# Patient Record
Sex: Male | Born: 1955
Health system: Southern US, Community
[De-identification: ages and names within clinical notes are randomized; demographics above are authoritative.]

## PROBLEM LIST (undated history)

## (undated) DIAGNOSIS — N529 Male erectile dysfunction, unspecified: Secondary | ICD-10-CM

## (undated) DIAGNOSIS — R7301 Impaired fasting glucose: Secondary | ICD-10-CM

## (undated) DIAGNOSIS — G4733 Obstructive sleep apnea (adult) (pediatric): Secondary | ICD-10-CM

## (undated) DIAGNOSIS — Z9989 Dependence on other enabling machines and devices: Secondary | ICD-10-CM

## (undated) DIAGNOSIS — E291 Testicular hypofunction: Secondary | ICD-10-CM

## (undated) DIAGNOSIS — I10 Essential (primary) hypertension: Secondary | ICD-10-CM

## (undated) DIAGNOSIS — E785 Hyperlipidemia, unspecified: Secondary | ICD-10-CM

## (undated) HISTORY — DX: Essential (primary) hypertension: I10

## (undated) HISTORY — DX: Dependence on other enabling machines and devices: Z99.89

## (undated) HISTORY — PX: NO PAST SURGERIES: SHX2092

## (undated) HISTORY — DX: Male erectile dysfunction, unspecified: N52.9

## (undated) HISTORY — DX: Obstructive sleep apnea (adult) (pediatric): G47.33

## (undated) HISTORY — DX: Hyperlipidemia, unspecified: E78.5

## (undated) HISTORY — DX: Testicular hypofunction: E29.1

---
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
## 2007-03-04 DIAGNOSIS — R9431 Abnormal electrocardiogram [ECG] [EKG]: Secondary | ICD-10-CM | POA: Insufficient documentation

## 2014-09-08 DIAGNOSIS — G4710 Hypersomnia, unspecified: Secondary | ICD-10-CM | POA: Insufficient documentation

## 2015-02-04 DIAGNOSIS — N401 Enlarged prostate with lower urinary tract symptoms: Secondary | ICD-10-CM | POA: Insufficient documentation

## 2015-02-25 LAB — HM COLONOSCOPY

## 2015-10-25 DIAGNOSIS — J40 Bronchitis, not specified as acute or chronic: Secondary | ICD-10-CM | POA: Diagnosis not present

## 2015-12-06 DIAGNOSIS — G4733 Obstructive sleep apnea (adult) (pediatric): Secondary | ICD-10-CM | POA: Diagnosis not present

## 2015-12-27 DIAGNOSIS — I10 Essential (primary) hypertension: Secondary | ICD-10-CM | POA: Diagnosis not present

## 2016-03-07 DIAGNOSIS — I10 Essential (primary) hypertension: Secondary | ICD-10-CM | POA: Diagnosis not present

## 2016-03-07 DIAGNOSIS — E785 Hyperlipidemia, unspecified: Secondary | ICD-10-CM | POA: Diagnosis not present

## 2016-03-07 DIAGNOSIS — G4733 Obstructive sleep apnea (adult) (pediatric): Secondary | ICD-10-CM | POA: Diagnosis not present

## 2016-03-07 DIAGNOSIS — R7301 Impaired fasting glucose: Secondary | ICD-10-CM | POA: Diagnosis not present

## 2016-03-08 DIAGNOSIS — G4710 Hypersomnia, unspecified: Secondary | ICD-10-CM | POA: Diagnosis not present

## 2016-03-08 DIAGNOSIS — G4733 Obstructive sleep apnea (adult) (pediatric): Secondary | ICD-10-CM | POA: Diagnosis not present

## 2016-03-31 DIAGNOSIS — B356 Tinea cruris: Secondary | ICD-10-CM | POA: Diagnosis not present

## 2016-06-07 DIAGNOSIS — G4733 Obstructive sleep apnea (adult) (pediatric): Secondary | ICD-10-CM | POA: Diagnosis not present

## 2016-06-07 DIAGNOSIS — H2513 Age-related nuclear cataract, bilateral: Secondary | ICD-10-CM | POA: Diagnosis not present

## 2016-09-06 DIAGNOSIS — G4733 Obstructive sleep apnea (adult) (pediatric): Secondary | ICD-10-CM | POA: Diagnosis not present

## 2016-09-07 DIAGNOSIS — G4733 Obstructive sleep apnea (adult) (pediatric): Secondary | ICD-10-CM | POA: Diagnosis not present

## 2016-09-07 DIAGNOSIS — I10 Essential (primary) hypertension: Secondary | ICD-10-CM | POA: Diagnosis not present

## 2017-04-02 DIAGNOSIS — Z7689 Persons encountering health services in other specified circumstances: Secondary | ICD-10-CM | POA: Diagnosis not present

## 2017-04-02 DIAGNOSIS — I10 Essential (primary) hypertension: Secondary | ICD-10-CM | POA: Diagnosis not present

## 2017-04-02 DIAGNOSIS — Z760 Encounter for issue of repeat prescription: Secondary | ICD-10-CM | POA: Diagnosis not present

## 2017-04-02 DIAGNOSIS — E785 Hyperlipidemia, unspecified: Secondary | ICD-10-CM | POA: Diagnosis not present

## 2017-05-01 DIAGNOSIS — G4733 Obstructive sleep apnea (adult) (pediatric): Secondary | ICD-10-CM | POA: Diagnosis not present

## 2017-09-16 DIAGNOSIS — G4733 Obstructive sleep apnea (adult) (pediatric): Secondary | ICD-10-CM | POA: Diagnosis not present

## 2017-09-17 DIAGNOSIS — I10 Essential (primary) hypertension: Secondary | ICD-10-CM | POA: Diagnosis not present

## 2017-09-17 DIAGNOSIS — G4710 Hypersomnia, unspecified: Secondary | ICD-10-CM | POA: Diagnosis not present

## 2017-09-17 DIAGNOSIS — G4733 Obstructive sleep apnea (adult) (pediatric): Secondary | ICD-10-CM | POA: Diagnosis not present

## 2017-09-30 ENCOUNTER — Encounter (INDEPENDENT_AMBULATORY_CARE_PROVIDER_SITE_OTHER): Payer: Self-pay

## 2017-09-30 ENCOUNTER — Encounter: Payer: Self-pay | Admitting: Physician Assistant

## 2017-09-30 ENCOUNTER — Ambulatory Visit (INDEPENDENT_AMBULATORY_CARE_PROVIDER_SITE_OTHER): Payer: BLUE CROSS/BLUE SHIELD | Admitting: Physician Assistant

## 2017-09-30 VITALS — BP 149/88 | HR 66 | Ht 70.0 in | Wt 189.0 lb

## 2017-09-30 DIAGNOSIS — Z7689 Persons encountering health services in other specified circumstances: Secondary | ICD-10-CM | POA: Diagnosis not present

## 2017-09-30 DIAGNOSIS — Z130 Encounter for screening for diseases of the blood and blood-forming organs and certain disorders involving the immune mechanism: Secondary | ICD-10-CM | POA: Diagnosis not present

## 2017-09-30 DIAGNOSIS — Z5181 Encounter for therapeutic drug level monitoring: Secondary | ICD-10-CM | POA: Diagnosis not present

## 2017-09-30 DIAGNOSIS — I10 Essential (primary) hypertension: Secondary | ICD-10-CM | POA: Insufficient documentation

## 2017-09-30 DIAGNOSIS — Z125 Encounter for screening for malignant neoplasm of prostate: Secondary | ICD-10-CM

## 2017-09-30 DIAGNOSIS — Z79899 Other long term (current) drug therapy: Secondary | ICD-10-CM | POA: Diagnosis not present

## 2017-09-30 DIAGNOSIS — N529 Male erectile dysfunction, unspecified: Secondary | ICD-10-CM | POA: Diagnosis not present

## 2017-09-30 DIAGNOSIS — Z113 Encounter for screening for infections with a predominantly sexual mode of transmission: Secondary | ICD-10-CM | POA: Diagnosis not present

## 2017-09-30 DIAGNOSIS — E291 Testicular hypofunction: Secondary | ICD-10-CM | POA: Diagnosis not present

## 2017-09-30 MED ORDER — SILDENAFIL CITRATE 20 MG PO TABS: ORAL_TABLET | ORAL | 2 refills | Status: DC

## 2017-09-30 MED ORDER — ASPIRIN EC 81 MG PO TBEC: 81.0000 mg | DELAYED_RELEASE_TABLET | Freq: Every day | ORAL | 3 refills | Status: DC

## 2017-09-30 MED ORDER — TRIAMTERENE-HCTZ 37.5-25 MG PO TABS: 1.0000 | ORAL_TABLET | Freq: Every day | ORAL | 0 refills | Status: DC

## 2017-09-30 MED ORDER — METOPROLOL TARTRATE 50 MG PO TABS: 50.0000 mg | ORAL_TABLET | Freq: Every day | ORAL | 0 refills | Status: DC

## 2017-09-30 NOTE — Progress Notes (Signed)
HPI:                                                                Andres Jordan is a 62 y.o. male who presents to [facility]: Primary Care [facility] today to establish care  Current concerns include: medication refills, ED  HTN: taking Traimterene-HCTZ and Metoprolol daily. Compliant with medications. Did not take his medication today because he just finished night shift. Does not check BP's at home. Denies vision change, headache, chest pain with exertion, orthopnea, lightheadedness, syncope and edema. Risk factors include: male sex, age>55, HLD, family history   ED: reports he has taken Sildenafil in the past, 3-4 tablets usually works for him. He is not currently sexually active. He has had 1 male partner in the last 6 months, uses condoms consistently. No history of STI. SHIM    Row Name 09/30/17 1515         SHIM: Over the last 6 months:   How do you rate your confidence that you could get and keep an erection?  Low     When you had erections with sexual stimulation, how often were your erections hard enough for penetration (entering your partner)?  Almost Never or Never     During sexual intercourse, how often were you able to maintain your erection after you had penetrated (entered) your partner?  Almost Never or Never     During sexual intercourse, how difficult was it to maintain your erection to completion of intercourse?  Very Difficult     When you attempted sexual intercourse, how often was it satisfactory for you?  Almost Never or Never       SHIM Total Score   SHIM  7       HLD: taking Zocor 40 mg. Mostly compliant with medications. Denies myalgias.  Depression screen [facility] 2/9 09/30/2017  Decreased Interest 0  Down, Depressed, Hopeless 0  PHQ - 2 Score 0    No flowsheet data found.    Past Medical History:  Diagnosis Date  . Erectile dysfunction   . Hyperlipidemia   . Hypertension   . Hypogonadism in male    Past Surgical  History:  Procedure Laterality Date  . NO PAST SURGERIES     Social History   Tobacco Use  . Smoking status: Never Smoker  . Smokeless tobacco: Never Used  Substance Use Topics  . Alcohol use: No    Frequency: Never   family history includes Aneurysm in his mother; Diabetes in his sister; Heart attack in his father; Hypertension in his sister.    ROS: negative except as noted in the HPI  Medications: Current Outpatient Medications  Medication Sig Dispense Refill  . metoprolol tartrate (LOPRESSOR) 50 MG tablet Take 1 tablet (50 mg total) by mouth daily. 30 tablet 0  . simvastatin (ZOCOR) 40 MG tablet Take 1 tablet by mouth daily.    Marland Kitchen triamterene-hydrochlorothiazide (MAXZIDE-25) 37.5-25 MG tablet Take 1 tablet by mouth daily. 30 tablet 0  . aspirin EC 81 MG tablet Take 1 tablet (81 mg total) by mouth daily. 90 tablet 3  . sildenafil (REVATIO) 20 MG tablet Take 1 - 5 tablets PO as needed 30 minutes prior to sexual activity 50 tablet 2  No current facility-administered medications for this visit.    No Known Allergies     Objective:  BP (!) 149/88   Pulse 66   Ht 5\' 10"  (1.778 m)   Wt 189 lb (85.7 kg)   BMI 27.12 kg/m  Gen:  alert, not ill-appearing, no distress, appropriate for age HEENT: head normocephalic without obvious abnormality, conjunctiva and cornea clear, trachea midline Pulm: Normal work of breathing, normal phonation, clear to auscultation bilaterally, no wheezes, rales or rhonchi CV: Normal rate, regular rhythm, s1 and s2 distinct, no murmurs, clicks or rubs  Neuro: alert and oriented x 3, no tremor MSK: extremities atraumatic, normal gait and station, no peripheral edema Skin: intact, no rashes on exposed skin, no jaundice, no cyanosis Psych: well-groomed, cooperative, good eye contact, euthymic mood, affect mood-congruent, speech is articulate, and thought processes clear and goal-directed    No results found for this or any previous visit (from the  past 72 hour(s)). No results found.    Assessment and Plan: 62 y.o. male with   1. Encounter to establish care - reviewed PMH, PSH, PFH, medications and allergies - reviewed health maintenance - colonoscopy UTD per patient, requesting records from [facility] - negative PHQ2 - influenza UTD per patient  2. Routine screening for STI (sexually transmitted infection) - low risk sexual behaviors. Not a candidate for PrEP - Hepatitis C antibody - HIV antibody - C. trachomatis/N. gonorrhoeae RNA - RPR  3. Screening PSA (prostate specific antigen) - PSA  4. Male hypogonadism - Testosterone  5. Erectile dysfunction, unspecified erectile dysfunction type - SHIM score 7 - sildenafil (REVATIO) 20 MG tablet; Take 1 - 5 tablets PO as needed 30 minutes prior to sexual activity  Dispense: 50 tablet; Refill: 2  6. Encounter for monitoring statin therapy - LDL goal<100. May switch him to Atorvastatin depending on LDL - Lipid Panel w/reflex Direct LDL  7. Hypertension goal BP (blood pressure) < 130/80 BP Readings from Last 3 Encounters:  09/30/17 (!) 149/88  - BP out of range. Patient has not had his medications today - aspirin 81 mg for primary prevention - counseled on therapeutic lifestyle changes - follow-up in 2 weeks for nurse BP check - metoprolol tartrate (LOPRESSOR) 50 MG tablet; Take 1 tablet (50 mg total) by mouth daily.  Dispense: 30 tablet; Refill: 0 - triamterene-hydrochlorothiazide (MAXZIDE-25) 37.5-25 MG tablet; Take 1 tablet by mouth daily.  Dispense: 30 tablet; Refill: 0 - COMPLETE METABOLIC PANEL WITH GFR - aspirin EC 81 MG tablet; Take 1 tablet (81 mg total) by mouth daily.  Dispense: 90 tablet; Refill: 3  8. Screening for blood disease - CBC - COMPLETE METABOLIC PANEL WITH GFR   Patient education and anticipatory guidance given Patient agrees with treatment plan Follow-up in 2 weeks for nurse BP check, then every 6 months for medication management as  needed if symptoms worsen or fail to improve  Levonne Hubertharley E. Treylen Gibbs PA-C

## 2017-09-30 NOTE — Patient Instructions (Signed)
For your blood pressure: - Goal <130/80 - continue your blood pressure medications daily - baby aspirin 81 mg daily to help prevent heart attack/stroke - monitor and log blood pressures at home - check around the same time each day in a relaxed setting - Limit salt to <2000 mg/day - Follow DASH eating plan - limit alcohol to 2 standard drinks per day for men and 1 per day for women - avoid tobacco products - weight loss: 7% of current body weight - follow-up every 6 months for your blood pressure    Engage in aerobic physical activity to reduce LDL-cholesterol, non-HDL-cholesterol, and blood pressure  Frequency: 3-4 sessions per week  Intensity: moderate to vigorous  Duration: 40 minutes on average  Physical Activity Recommendations for secondary prevention 1. Aerobic exercise  Frequency: 3-5 sessions per week  Intensity: 50-80% capacity  Duration: 20 - 60 minutes  Examples: walking, treadmill, cycling, rowing, stair climbing, and arm/leg ergometry  2. Resistance exercise  Frequency: 2-3 sessions per week  Intensity: 10-15 repetitions/set to moderate fatigue  Duration: 1-3 sets of 8-10 upper and lower body exercises  Examples: calisthenics, elastic bands, cuff/hand weights, dumbbels, free weights, wall pulleys, and weight machines  Heart-Healthy Lifestyle  Eating a diet rich in vegetables, fruits and whole grains: also includes low-fat dairy products, poultry, fish, legumes, and nuts; limit intake of sweets, sugar-sweetened beverages and red meats  Getting regular exercise  Maintaining a healthy weight  Not smoking or getting help quitting  Staying on top of your health; for some people, lifestyle changes alone may not be enough to prevent a heart attack or stroke. In these cases, taking a statin at the right dose will most likely be necessary

## 2017-10-01 ENCOUNTER — Encounter: Payer: Self-pay | Admitting: Physician Assistant

## 2017-10-01 ENCOUNTER — Other Ambulatory Visit: Payer: Self-pay | Admitting: Physician Assistant

## 2017-10-01 DIAGNOSIS — I10 Essential (primary) hypertension: Secondary | ICD-10-CM

## 2017-10-01 DIAGNOSIS — E782 Mixed hyperlipidemia: Secondary | ICD-10-CM | POA: Insufficient documentation

## 2017-10-01 DIAGNOSIS — Z9989 Dependence on other enabling machines and devices: Secondary | ICD-10-CM

## 2017-10-01 DIAGNOSIS — G4733 Obstructive sleep apnea (adult) (pediatric): Secondary | ICD-10-CM

## 2017-10-01 HISTORY — DX: Obstructive sleep apnea (adult) (pediatric): G47.33

## 2017-10-01 LAB — HIV ANTIBODY (ROUTINE TESTING W REFLEX): HIV 1&2 Ab, 4th Generation: NONREACTIVE

## 2017-10-01 LAB — COMPLETE METABOLIC PANEL WITH GFR
AG Ratio: 1.9 (calc) (ref 1.0–2.5)
ALBUMIN MSPROF: 4.4 g/dL (ref 3.6–5.1)
ALT: 28 U/L (ref 9–46)
AST: 23 U/L (ref 10–35)
Alkaline phosphatase (APISO): 72 U/L (ref 40–115)
BUN: 24 mg/dL (ref 7–25)
CALCIUM: 9.8 mg/dL (ref 8.6–10.3)
CO2: 32 mmol/L (ref 20–32)
CREATININE: 1.06 mg/dL (ref 0.70–1.25)
Chloride: 103 mmol/L (ref 98–110)
GFR, EST NON AFRICAN AMERICAN: 75 mL/min/{1.73_m2} (ref >=60)
GFR, Est African American: 87 mL/min/{1.73_m2} (ref >=60)
GLOBULIN: 2.3 g/dL (ref 1.9–3.7)
Glucose, Bld: 103 mg/dL — ABNORMAL HIGH (ref 65–99)
Potassium: 3.8 mmol/L (ref 3.5–5.3)
SODIUM: 141 mmol/L (ref 135–146)
Total Bilirubin: 0.7 mg/dL (ref 0.2–1.2)
Total Protein: 6.7 g/dL (ref 6.1–8.1)

## 2017-10-01 LAB — CBC
HCT: 47.8 % (ref 38.5–50.0)
HEMOGLOBIN: 16.8 g/dL (ref 13.2–17.1)
MCH: 31.3 pg (ref 27.0–33.0)
MCHC: 35.1 g/dL (ref 32.0–36.0)
MCV: 89.2 fL (ref 80.0–100.0)
MPV: 10.1 fL (ref 7.5–12.5)
Platelets: 230 10*3/uL (ref 140–400)
RBC: 5.36 10*6/uL (ref 4.20–5.80)
RDW: 12.6 % (ref 11.0–15.0)
WBC: 4.6 10*3/uL (ref 3.8–10.8)

## 2017-10-01 LAB — LIPID PANEL W/REFLEX DIRECT LDL
CHOL/HDL RATIO: 5.2 (calc) — AB (ref <5.0)
Cholesterol: 267 mg/dL — ABNORMAL HIGH (ref <200)
HDL: 51 mg/dL (ref >40)
LDL CHOLESTEROL (CALC): 177 mg/dL — AB
NON-HDL CHOLESTEROL (CALC): 216 mg/dL — AB (ref <130)
TRIGLYCERIDES: 217 mg/dL — AB (ref <150)

## 2017-10-01 LAB — HEPATITIS C ANTIBODY
Hepatitis C Ab: NONREACTIVE
SIGNAL TO CUT-OFF: 0.05 (ref <1.00)

## 2017-10-01 LAB — C. TRACHOMATIS/N. GONORRHOEAE RNA
C. TRACHOMATIS RNA, TMA: NOT DETECTED
N. gonorrhoeae RNA, TMA: NOT DETECTED

## 2017-10-01 LAB — PSA: PSA: 2.1 ng/mL (ref <=4.0)

## 2017-10-01 LAB — RPR: RPR Ser Ql: NONREACTIVE

## 2017-10-01 LAB — TESTOSTERONE: Testosterone: 342 ng/dL (ref 250–827)

## 2017-10-01 MED ORDER — METOPROLOL TARTRATE 50 MG PO TABS: 50.0000 mg | ORAL_TABLET | Freq: Every day | ORAL | 1 refills | Status: DC

## 2017-10-01 MED ORDER — TRIAMTERENE-HCTZ 37.5-25 MG PO TABS: 1.0000 | ORAL_TABLET | Freq: Every day | ORAL | 1 refills | Status: DC

## 2017-10-01 MED ORDER — ATORVASTATIN CALCIUM 20 MG PO TABS: 20.0000 mg | ORAL_TABLET | Freq: Every day | ORAL | 1 refills | Status: DC

## 2017-10-01 NOTE — Progress Notes (Signed)
Addendum: testosterone is in a normal range. No medication needed

## 2017-10-01 NOTE — Progress Notes (Signed)
STI screening negative LDL cholesterol is very high (177) and we want this below 100. I am going to switch him to Atorvastatin 20 mg at bedtime and want to recheck fasting lipid panel in 3 months Refilling other medications for 6 month supply

## 2017-10-14 ENCOUNTER — Ambulatory Visit: Payer: BLUE CROSS/BLUE SHIELD

## 2017-10-15 ENCOUNTER — Ambulatory Visit (INDEPENDENT_AMBULATORY_CARE_PROVIDER_SITE_OTHER): Payer: BLUE CROSS/BLUE SHIELD | Admitting: Physician Assistant

## 2017-10-15 VITALS — BP 121/60 | HR 69 | Temp 97.7°F | Resp 16 | Wt 193.1 lb

## 2017-10-15 DIAGNOSIS — I10 Essential (primary) hypertension: Secondary | ICD-10-CM | POA: Diagnosis not present

## 2017-10-15 NOTE — Progress Notes (Signed)
HPI: Patient is here for a blood pressure check. Patient denies chest pains, palpitations, shortness of breath, blurred vision, dizziness or medication problems.   Vitals:   10/15/17 1003  BP: 121/60  Pulse: 69  Resp: 16  Temp: 97.7 F (36.5 C)  SpO2: 96%   BP Readings from Last 3 Encounters:  10/15/17 121/60  09/30/17 (!) 149/88    Assessment and Plan: Patient blood pressure reading was with normal limits. Patient advised he will be contacted by our office if another nurse visit is needed once provider reviews results.

## 2017-10-16 ENCOUNTER — Encounter: Payer: Self-pay | Admitting: Physician Assistant

## 2018-02-03 DIAGNOSIS — G4733 Obstructive sleep apnea (adult) (pediatric): Secondary | ICD-10-CM | POA: Diagnosis not present

## 2018-02-10 ENCOUNTER — Encounter: Payer: Self-pay | Admitting: Emergency Medicine

## 2018-02-10 ENCOUNTER — Emergency Department (INDEPENDENT_AMBULATORY_CARE_PROVIDER_SITE_OTHER): Payer: BLUE CROSS/BLUE SHIELD

## 2018-02-10 ENCOUNTER — Emergency Department
Admission: EM | Admit: 2018-02-10 | Discharge: 2018-02-10 | Disposition: A | Discharge status: Home / Self Care | Payer: BLUE CROSS/BLUE SHIELD | Source: Home / Self Care | Attending: Family Medicine | Admitting: Family Medicine

## 2018-02-10 DIAGNOSIS — M79675 Pain in left toe(s): Secondary | ICD-10-CM | POA: Diagnosis not present

## 2018-02-10 DIAGNOSIS — M19072 Primary osteoarthritis, left ankle and foot: Secondary | ICD-10-CM

## 2018-02-10 DIAGNOSIS — M7989 Other specified soft tissue disorders: Secondary | ICD-10-CM

## 2018-02-10 DIAGNOSIS — M7732 Calcaneal spur, left foot: Secondary | ICD-10-CM | POA: Diagnosis not present

## 2018-02-10 MED ORDER — NAPROXEN 375 MG PO TABS: 375.0000 mg | ORAL_TABLET | Freq: Two times a day (BID) | ORAL | 0 refills | Status: DC

## 2018-02-10 NOTE — ED Triage Notes (Signed)
Pt c/o left foot pain, red and swollen.

## 2018-02-10 NOTE — Discharge Instructions (Addendum)
°  Your x-ray today shows that you have arthritis in your big toe joint. This is likely causing your pain and swelling but you may also have gout.  The blood test should result in another 2-3 days and you will be notified of the results.  Please see more information in this packet about gout and what you should eat and what you should avoid eating to help reduce gout pain and gout attacks.  Please follow up with your family doctor in 1 week if not improving, sooner if worsening or you get recurrent pain.

## 2018-02-10 NOTE — ED Provider Notes (Signed)
[name]    CSN: [id number] Arrival date & time: 02/10/18  1107     History   Chief Complaint Chief Complaint  Patient presents with  . Foot Pain    HPI Andres Jordan is a 62 y.o. male.   HPI  Andres Jordan is a 62 y.o. male presenting to UC with c/o 2 days of Left foot pain and swelling, most around his great toe.  Pain is aching and sore, worse with light touch. Moderate in severity. No prior hx of gout but his brother had gout 1 month ago.  Pt works night shift in [employer], which makes pain worse. No known injury.    Past Medical History:  Diagnosis Date  . Erectile dysfunction   . Hyperlipidemia   . Hypertension   . Hypogonadism in male   . OSA on CPAP 10/01/2017    Patient Active Problem List   Diagnosis Date Noted  . Mixed hyperlipidemia 10/01/2017  . OSA on CPAP 10/01/2017  . Erectile dysfunction 09/30/2017  . Encounter for monitoring statin therapy 09/30/2017  . Hypertension goal BP (blood pressure) < 130/80 09/30/2017  . Male hypogonadism 09/30/2017  . Benign non-nodular prostatic hyperplasia with lower urinary tract symptoms 02/04/2015  . Hypersomnia 09/08/2014  . Abnormal electrocardiogram 03/04/2007    Past Surgical History:  Procedure Laterality Date  . NO PAST SURGERIES         Home Medications    Prior to Admission medications   Medication Sig Start Date End Date Taking? Authorizing Provider  aspirin EC 81 MG tablet Take 1 tablet (81 mg total) by mouth daily. 09/30/17   Carlis Stable, PA-C  atorvastatin (LIPITOR) 20 MG tablet Take 1 tablet (20 mg total) by mouth at bedtime. 10/01/17   Carlis Stable, PA-C  metoprolol tartrate (LOPRESSOR) 50 MG tablet Take 1 tablet (50 mg total) by mouth daily. 10/01/17   Carlis Stable, PA-C  naproxen (NAPROSYN) 375 MG tablet Take 1 tablet (375 mg total) by mouth 2 (two) times daily. 02/10/18   Lurene Shadow, PA-C  sildenafil (REVATIO) 20 MG tablet  Take 1 - 5 tablets PO as needed 30 minutes prior to sexual activity 09/30/17   Carlis Stable, PA-C  triamterene-hydrochlorothiazide (MAXZIDE-25) 37.5-25 MG tablet Take 1 tablet by mouth daily. 10/01/17   Carlis Stable, PA-C    Family History Family History  Problem Relation Age of Onset  . Diabetes Sister   . Hypertension Sister   . Aneurysm Mother   . Heart attack Father     Social History Social History   Tobacco Use  . Smoking status: Never Smoker  . Smokeless tobacco: Never Used  Substance Use Topics  . Alcohol use: No    Frequency: Never  . Drug use: No     Allergies   Patient has no known allergies.   Review of Systems Review of Systems  Constitutional: Negative for chills and fever.  Musculoskeletal: Positive for arthralgias and joint swelling. Negative for myalgias.  Skin: Positive for color change. Negative for wound.  Neurological: Negative for weakness and numbness.     Physical Exam Triage Vital Signs ED Triage Vitals  Enc Vitals Group     BP 02/10/18 1153 (!) 166/80     Pulse Rate 02/10/18 1153 65     Resp --      Temp 02/10/18 1153 98.1 F (36.7 C)     Temp Source 02/10/18 1153 Oral  SpO2 02/10/18 1153 97 %     Weight 02/10/18 1154 195 lb (88.5 kg)     Height --      Head Circumference --      Peak Flow --      Pain Score 02/10/18 1154 9     Pain Loc --      Pain Edu? --      Excl. in GC? --    No data found.  Updated Vital Signs BP (!) 166/80 (BP Location: Right Arm)   Pulse 65   Temp 98.1 F (36.7 C) (Oral)   Wt 195 lb (88.5 kg)   SpO2 97%   BMI 27.98 kg/m   Visual Acuity Right Eye Distance:   Left Eye Distance:   Bilateral Distance:    Right Eye Near:   Left Eye Near:    Bilateral Near:     Physical Exam  Constitutional: He is oriented to person, place, and time. He appears well-developed and well-nourished.  HENT:  Head: Normocephalic and atraumatic.  Eyes: EOM are normal.  Neck: Normal  range of motion.  Cardiovascular: Normal rate.  Pulmonary/Chest: Effort normal.  Musculoskeletal: Normal range of motion. He exhibits edema and tenderness.  Left foot: mild edema to foot and great toe. Tenderness with light touch of 1st metatarsal joint. Full ROM but increased pain at great toe  Neurological: He is alert and oriented to person, place, and time.  Skin: Skin is warm and dry. Capillary refill takes less than 2 seconds.  Left foot: skin in tact. Erythema around 1st metatarsal joint  Psychiatric: He has a normal mood and affect. His behavior is normal.  Nursing note and vitals reviewed.    UC Treatments / Results  Labs (all labs ordered are listed, but only abnormal results are displayed) Labs Reviewed  URIC ACID    EKG None  Radiology Dg Foot Complete Left  Result Date: 02/10/2018 CLINICAL DATA:  Three-day history of LEFT great toe pain, erythema and swelling. No known injuries. EXAM: LEFT FOOT - COMPLETE 3+ VIEW COMPARISON:  None. FINDINGS: No evidence of acute, subacute or healed fractures. Mild narrowing of the first MTP joint space with associated hypertrophic spurring laterally. No erosive changes. Remaining joint spaces well-preserved throughout the foot. Bone mineral density well-preserved. Benign bone island in the head of the proximal phalanx of the great toe. Small to moderate-sized plantar calcaneal spur. IMPRESSION: 1. Mild osteoarthritis involving the first MTP joint. No evidence of erosions to confirm gout. 2. Moderate-sized plantar calcaneal spur. Electronically Signed   By: Hulan Saas M.D.   On: 02/10/2018 12:33    Procedures Procedures (including critical care time)  Medications Ordered in UC Medications - No data to display  Initial Impression / Assessment and Plan / UC Course  I have reviewed the triage vital signs and the nursing notes.  Pertinent labs & imaging results that were available during my care of the patient were reviewed by me  and considered in my medical decision making (see chart for details).     Hx and exam c/w OA of Left great toe and likely gout as well given erythematous skin and tenderness with light touch.  Uric acid blood work pending  Final Clinical Impressions(s) / UC Diagnoses   Final diagnoses:  Great toe pain, left  Pain and swelling of toe of left foot     Discharge Instructions      Your x-ray today shows that you have arthritis in your big toe  joint. This is likely causing your pain and swelling but you may also have gout.  The blood test should result in another 2-3 days and you will be notified of the results.  Please see more information in this packet about gout and what you should eat and what you should avoid eating to help reduce gout pain and gout attacks.  Please follow up with your family doctor in 1 week if not improving, sooner if worsening or you get recurrent pain.     ED Prescriptions    Medication Sig Dispense Auth. Provider   naproxen (NAPROSYN) 375 MG tablet Take 1 tablet (375 mg total) by mouth 2 (two) times daily. 20 tablet Lurene ShadowPhelps, Kenza Munar O, PA-C     Controlled Substance Prescriptions [place] Controlled Substance Registry consulted? Not Applicable   Rolla Platehelps, Consuela Widener O, PA-C 02/10/18 1346

## 2018-02-11 ENCOUNTER — Telehealth: Payer: Self-pay

## 2018-02-11 LAB — URIC ACID: Uric Acid, Serum: 7.7 mg/dL (ref 4.0–8.0)

## 2018-02-11 NOTE — Telephone Encounter (Signed)
Left message with lab results and to call UC if any questions or concerns.

## 2018-03-24 ENCOUNTER — Emergency Department
Admission: EM | Admit: 2018-03-24 | Discharge: 2018-03-24 | Disposition: A | Discharge status: Home / Self Care | Payer: BLUE CROSS/BLUE SHIELD | Source: Home / Self Care | Attending: Family Medicine | Admitting: Family Medicine

## 2018-03-24 ENCOUNTER — Encounter: Payer: Self-pay | Admitting: Emergency Medicine

## 2018-03-24 DIAGNOSIS — R221 Localized swelling, mass and lump, neck: Secondary | ICD-10-CM

## 2018-03-24 MED ORDER — CLINDAMYCIN HCL 300 MG PO CAPS: 300.0000 mg | ORAL_CAPSULE | Freq: Three times a day (TID) | ORAL | 0 refills | Status: AC

## 2018-03-24 NOTE — Discharge Instructions (Signed)
°  Please take antibiotics as prescribed and be sure to complete entire course even if you start to feel better to ensure infection does not come back.  An ultrasound has been ordered for further evaluation of your neck swelling.  You may have that ultrasound performed at [facility] and follow up with family medicine this week or return to urgent care if needed.

## 2018-03-24 NOTE — ED Triage Notes (Signed)
Pt c/o facial swelling on left side x2 days. Started after eating.

## 2018-03-24 NOTE — ED Provider Notes (Addendum)
[name]    CSN: [id number] Arrival date & time: 03/24/18  1332     History   Chief Complaint Chief Complaint  Patient presents with  . Facial Swelling    HPI Andres Jordan is a 62 y.o. male.   HPI Andres Jordan is a 62 y.o. male presenting to UC with c/o Left sided neck swelling with mild soreness on that side, occasional sharp pain with eating breakfast this morning. The swelling and soreness started about 2 days ago. He took Benadryl last night as recommended by a friend but no relief. Denies throat pain when swallowing.  Denies fever, chills, n/v/d.    Past Medical History:  Diagnosis Date  . Erectile dysfunction   . Hyperlipidemia   . Hypertension   . Hypogonadism in male   . OSA on CPAP 10/01/2017    Patient Active Problem List   Diagnosis Date Noted  . Mixed hyperlipidemia 10/01/2017  . OSA on CPAP 10/01/2017  . Erectile dysfunction 09/30/2017  . Encounter for monitoring statin therapy 09/30/2017  . Hypertension goal BP (blood pressure) < 130/80 09/30/2017  . Male hypogonadism 09/30/2017  . Benign non-nodular prostatic hyperplasia with lower urinary tract symptoms 02/04/2015  . Hypersomnia 09/08/2014  . Abnormal electrocardiogram 03/04/2007    Past Surgical History:  Procedure Laterality Date  . NO PAST SURGERIES         Home Medications    Prior to Admission medications   Medication Sig Start Date End Date Taking? Authorizing Provider  aspirin EC 81 MG tablet Take 1 tablet (81 mg total) by mouth daily. 09/30/17   Carlis Stable, PA-C  atorvastatin (LIPITOR) 20 MG tablet Take 1 tablet (20 mg total) by mouth at bedtime. 10/01/17   Carlis Stable, PA-C  clindamycin (CLEOCIN) 300 MG capsule Take 1 capsule (300 mg total) by mouth 3 (three) times daily for 7 days. X 7 days 03/24/18 03/31/18  Lurene Shadow, PA-C  metoprolol tartrate (LOPRESSOR) 50 MG tablet Take 1 tablet (50 mg total) by mouth daily. 10/01/17   Carlis Stable, PA-C  naproxen (NAPROSYN) 375 MG tablet Take 1 tablet (375 mg total) by mouth 2 (two) times daily. 02/10/18   Lurene Shadow, PA-C  sildenafil (REVATIO) 20 MG tablet Take 1 - 5 tablets PO as needed 30 minutes prior to sexual activity 09/30/17   Carlis Stable, PA-C  triamterene-hydrochlorothiazide (MAXZIDE-25) 37.5-25 MG tablet Take 1 tablet by mouth daily. 10/01/17   Carlis Stable, PA-C    Family History Family History  Problem Relation Age of Onset  . Diabetes Sister   . Hypertension Sister   . Aneurysm Mother   . Heart attack Father     Social History Social History   Tobacco Use  . Smoking status: Never Smoker  . Smokeless tobacco: Never Used  Substance Use Topics  . Alcohol use: No    Frequency: Never  . Drug use: No     Allergies   Patient has no known allergies.   Review of Systems Review of Systems  Constitutional: Negative for chills, diaphoresis, fever and unexpected weight change.  HENT: Positive for sore throat. Negative for congestion, ear pain, trouble swallowing and voice change.   Respiratory: Negative for cough and stridor.   Gastrointestinal: Negative for diarrhea, nausea and vomiting.  Musculoskeletal: Positive for neck pain. Negative for neck stiffness.  Skin: Negative for rash.     Physical Exam Triage Vital Signs ED Triage Vitals  Enc Vitals  Group     BP 03/24/18 1404 (!) 155/87     Pulse Rate 03/24/18 1404 63     Resp --      Temp 03/24/18 1404 99.1 F (37.3 C)     Temp src --      SpO2 03/24/18 1404 97 %     Weight 03/24/18 1406 194 lb (88 kg)     Height --      Head Circumference --      Peak Flow --      Pain Score 03/24/18 1406 0     Pain Loc --      Pain Edu? --      Excl. in GC? --    No data found.  Updated Vital Signs BP (!) 155/87 (BP Location: Right Arm)   Pulse 63   Temp 99.1 F (37.3 C)   Wt 194 lb (88 kg)   SpO2 97%   BMI 27.84 kg/m   Visual Acuity Right Eye  Distance:   Left Eye Distance:   Bilateral Distance:    Right Eye Near:   Left Eye Near:    Bilateral Near:     Physical Exam  Constitutional: He is oriented to person, place, and time. He appears well-developed and well-nourished. No distress.  HENT:  Head: Normocephalic and atraumatic.  Right Ear: Tympanic membrane normal.  Left Ear: Tympanic membrane normal.  Nose: Nose normal. Right sinus exhibits no maxillary sinus tenderness and no frontal sinus tenderness. Left sinus exhibits no maxillary sinus tenderness and no frontal sinus tenderness.  Mouth/Throat: Uvula is midline, oropharynx is clear and moist and mucous membranes are normal.  Eyes: EOM are normal.  Neck: Normal range of motion. Neck supple.    Cardiovascular: Normal rate and regular rhythm.  Pulmonary/Chest: Effort normal and breath sounds normal. No stridor. No respiratory distress. He has no wheezes. He has no rales.  Musculoskeletal: Normal range of motion.  Neurological: He is alert and oriented to person, place, and time.  Skin: Skin is warm and dry. No rash noted. He is not diaphoretic. No erythema.  Psychiatric: He has a normal mood and affect. His behavior is normal.  Nursing note and vitals reviewed.    UC Treatments / Results  Labs (all labs ordered are listed, but only abnormal results are displayed) Labs Reviewed - No data to display  EKG None  Radiology No results found.  Procedures Procedures (including critical care time)  Medications Ordered in UC Medications - No data to display  Initial Impression / Assessment and Plan / UC Course  I have reviewed the triage vital signs and the nursing notes.  Pertinent labs & imaging results that were available during my care of the patient were reviewed by me and considered in my medical decision making (see chart for details).     Left side neck mass. Minimally tender. Question early abscess vs lymph node vs malignancy  Due to sudden onset,  most likely infection. Will start pt on clindamycin. Ultrasound of neck ordered.  Encouraged f/u with PCP   Final Clinical Impressions(s) / UC Diagnoses   Final diagnoses:  Neck swelling     Discharge Instructions      Please take antibiotics as prescribed and be sure to complete entire course even if you start to feel better to ensure infection does not come back.  An ultrasound has been ordered for further evaluation of your neck swelling.  You may have that ultrasound performed at [facility]  and follow up with family medicine this week or return to urgent care if needed.    ED Prescriptions    Medication Sig Dispense Auth. Provider   clindamycin (CLEOCIN) 300 MG capsule Take 1 capsule (300 mg total) by mouth 3 (three) times daily for 7 days. X 7 days 21 capsule Lurene Shadow, [place]     Controlled Substance Prescriptions  Controlled Substance Registry consulted? Not Applicable   Rolla Plate 03/24/18 1550    Lurene Shadow, PA-C 03/24/18 1550

## 2018-03-26 ENCOUNTER — Telehealth: Payer: Self-pay

## 2018-03-26 NOTE — Telephone Encounter (Signed)
Spoke with patient, feels like swelling has decreased, has [place] scheduled for tomorrow.

## 2018-03-27 ENCOUNTER — Ambulatory Visit (INDEPENDENT_AMBULATORY_CARE_PROVIDER_SITE_OTHER): Payer: BLUE CROSS/BLUE SHIELD

## 2018-03-27 DIAGNOSIS — R221 Localized swelling, mass and lump, neck: Secondary | ICD-10-CM | POA: Diagnosis not present

## 2018-03-27 DIAGNOSIS — R590 Localized enlarged lymph nodes: Secondary | ICD-10-CM | POA: Diagnosis not present

## 2018-05-08 ENCOUNTER — Other Ambulatory Visit: Payer: Self-pay | Admitting: Physician Assistant

## 2018-05-08 DIAGNOSIS — I10 Essential (primary) hypertension: Secondary | ICD-10-CM

## 2018-05-28 DIAGNOSIS — G4733 Obstructive sleep apnea (adult) (pediatric): Secondary | ICD-10-CM | POA: Diagnosis not present

## 2018-06-11 ENCOUNTER — Other Ambulatory Visit: Payer: Self-pay

## 2018-06-11 ENCOUNTER — Telehealth: Payer: Self-pay | Admitting: Physician Assistant

## 2018-06-11 DIAGNOSIS — E782 Mixed hyperlipidemia: Secondary | ICD-10-CM

## 2018-06-11 DIAGNOSIS — I10 Essential (primary) hypertension: Secondary | ICD-10-CM

## 2018-06-11 MED ORDER — METOPROLOL TARTRATE 50 MG PO TABS: 50.0000 mg | ORAL_TABLET | Freq: Every day | ORAL | 0 refills | Status: DC

## 2018-06-11 MED ORDER — ATORVASTATIN CALCIUM 20 MG PO TABS: 20.0000 mg | ORAL_TABLET | Freq: Every day | ORAL | 0 refills | Status: DC

## 2018-06-11 MED ORDER — TRIAMTERENE-HCTZ 37.5-25 MG PO TABS: 1.0000 | ORAL_TABLET | Freq: Every day | ORAL | 0 refills | Status: DC

## 2018-06-11 NOTE — Telephone Encounter (Signed)
Refills sent. Pt notified -EH/RMA

## 2018-06-11 NOTE — Telephone Encounter (Signed)
Pt stopped by. He needs a refill on Lisinopril, Simvastatin, Maxide, & Metoprolol. He has scheduled an appointment for November 27th.  Thanks

## 2018-06-18 ENCOUNTER — Ambulatory Visit (INDEPENDENT_AMBULATORY_CARE_PROVIDER_SITE_OTHER): Payer: BLUE CROSS/BLUE SHIELD | Admitting: Physician Assistant

## 2018-06-18 ENCOUNTER — Encounter: Payer: Self-pay | Admitting: Physician Assistant

## 2018-06-18 VITALS — BP 155/90 | HR 65 | Wt 200.0 lb

## 2018-06-18 DIAGNOSIS — I10 Essential (primary) hypertension: Secondary | ICD-10-CM

## 2018-06-18 DIAGNOSIS — E782 Mixed hyperlipidemia: Secondary | ICD-10-CM

## 2018-06-18 DIAGNOSIS — N529 Male erectile dysfunction, unspecified: Secondary | ICD-10-CM | POA: Diagnosis not present

## 2018-06-18 DIAGNOSIS — Z79899 Other long term (current) drug therapy: Secondary | ICD-10-CM | POA: Diagnosis not present

## 2018-06-18 MED ORDER — ATORVASTATIN CALCIUM 20 MG PO TABS: 20.0000 mg | ORAL_TABLET | Freq: Every day | ORAL | 1 refills | Status: DC

## 2018-06-18 MED ORDER — TRIAMTERENE-HCTZ 37.5-25 MG PO TABS: 1.0000 | ORAL_TABLET | Freq: Every day | ORAL | 1 refills | Status: DC

## 2018-06-18 MED ORDER — SILDENAFIL CITRATE 20 MG PO TABS: ORAL_TABLET | ORAL | 2 refills | Status: DC

## 2018-06-18 MED ORDER — METOPROLOL TARTRATE 50 MG PO TABS: 50.0000 mg | ORAL_TABLET | Freq: Every day | ORAL | 1 refills | Status: DC

## 2018-06-18 NOTE — Patient Instructions (Addendum)
If your Sildenafil is too expensive at [facility], there is an alternative pharmacy which may be more affordable [facility] [address] [place], [place] [phone]   Peripheral Edema Peripheral edema is swelling that is caused by a buildup of fluid. Peripheral edema most often affects the lower legs, ankles, and feet. It can also develop in the arms, hands, and face. The area of the body that has peripheral edema will look swollen. It may also feel heavy or warm. Your clothes may start to feel tight. Pressing on the area may make a temporary dent in your skin. You may not be able to move your arm or leg as much as usual. There are many causes of peripheral edema. It can be a complication of other diseases, such as congestive heart failure, kidney disease, or a problem with your blood circulation. It also can be a side effect of certain medicines. It often happens to women during pregnancy. Sometimes, the cause is not known. Treating the underlying condition is often the only treatment for peripheral edema. Follow these instructions at home: Pay attention to any changes in your symptoms. Take these actions to help with your discomfort:  Raise (elevate) your legs while you are sitting or lying down.  Move around often to prevent stiffness and to lessen swelling. Do not sit or stand for long periods of time.  Wear support stockings as told by your health care provider.  Follow instructions from your health care provider about limiting salt (sodium) in your diet. Sometimes eating less salt can reduce swelling.  Take over-the-counter and prescription medicines only as told by your health care provider. Your health care provider may prescribe medicine to help your body get rid of excess water (diuretic).  Keep all follow-up visits as told by your health care provider. This is important.  Contact a health care provider if:  You have a fever.  Your edema starts suddenly or is  getting worse, especially if you are pregnant or have a medical condition.  You have swelling in only one leg.  You have increased swelling and pain in your legs. Get help right away if:  You develop shortness of breath, especially when you are lying down.  You have pain in your chest or abdomen.  You feel weak.  You faint. This information is not intended to replace advice given to you by your health care provider. Make sure you discuss any questions you have with your health care provider. Document Released: 08/16/2004 Document Revised: 12/12/2015 Document Reviewed: 01/19/2015 Elsevier Interactive Patient Education  [employer]2018 [employer] Inc.

## 2018-06-18 NOTE — Progress Notes (Signed)
HPI:                                                                Andres Jordan is a 62 y.o. male who [facility] today for medication management    Depression screen [facility] 2/9 09/30/2017  Decreased Interest 0  Down, Depressed, Hopeless 0  PHQ - 2 Score 0    No flowsheet data found.    Past Medical History:  Diagnosis Date  . Erectile dysfunction   . Hyperlipidemia   . Hypertension   . Hypogonadism in male   . OSA on CPAP 10/01/2017   Past Surgical History:  Procedure Laterality Date  . NO PAST SURGERIES     Social History   Tobacco Use  . Smoking status: Never Smoker  . Smokeless tobacco: Never Used  Substance Use Topics  . Alcohol use: No    Frequency: Never   family history includes Aneurysm in his mother; Diabetes in his sister; Heart attack in his father; Hypertension in his sister.    ROS: negative except as noted in the HPI  Medications: Current Outpatient Medications  Medication Sig Dispense Refill  . aspirin EC 81 MG tablet Take 1 tablet (81 mg total) by mouth daily. 90 tablet 3  . atorvastatin (LIPITOR) 20 MG tablet Take 1 tablet (20 mg total) by mouth at bedtime. 90 tablet 1  . metoprolol tartrate (LOPRESSOR) 50 MG tablet Take 1 tablet (50 mg total) by mouth daily. 90 tablet 1  . sildenafil (REVATIO) 20 MG tablet Take 1 - 5 tablets PO as needed 30 minutes prior to sexual activity 50 tablet 2  . triamterene-hydrochlorothiazide (MAXZIDE-25) 37.5-25 MG tablet Take 1 tablet by mouth daily. 90 tablet 1   No current facility-administered medications for this visit.    No Known Allergies     Objective:  BP (!) 155/90   Pulse 65   Wt 200 lb (90.7 kg)   BMI 28.70 kg/m  Gen:  alert, not ill-appearing, no distress, appropriate for age HEENT: head normocephalic without obvious abnormality, conjunctiva and cornea clear, trachea midline Pulm: Normal work of breathing, normal phonation, breath  sounds are slightly coarse on expiration CV: Normal rate, regular rhythm, s1 and s2 distinct, no murmurs, clicks or rubs  Neuro: alert and oriented x 3, no tremor MSK: extremities atraumatic, normal gait and station, 1+ peripheral edema Skin: intact, no rashes on exposed skin, no jaundice, no cyanosis Psych: cooperative, good eye contact, euthymic mood, affect mood-congruent, speech is articulate, and thought processes clear and goal-directed  Lab Results  Component Value Date   CREATININE 1.06 09/30/2017   BUN 24 09/30/2017   NA 141 09/30/2017   K 3.8 09/30/2017   CL 103 09/30/2017   CO2 32 09/30/2017   Lab Results  Component Value Date   ALT 28 09/30/2017   AST 23 09/30/2017   BILITOT 0.7 09/30/2017   Lab Results  Component Value Date   CHOL 267 (H) 09/30/2017   HDL 51 09/30/2017   LDLCALC 177 (H) 09/30/2017   TRIG 217 (H) 09/30/2017   CHOLHDL 5.2 (H) 09/30/2017   The 10-year ASCVD risk score Denman George DC Jr., et al., 2013) is: 21.3%   Values used to calculate  the score:     Age: [age] years     Sex: Male     Is Non-Hispanic African American: No     Diabetic: No     Tobacco smoker: No     Systolic Blood Pressure: 155 mmHg     Is BP treated: Yes     HDL Cholesterol: 51 mg/dL     Total Cholesterol: 267 mg/dL   No results found for this or any previous visit (from the past 72 hour(s)). No results found.    Assessment and Plan: 62 y.o. male with   .Andres Jordan was seen today for hypertension.  Diagnoses and all orders for this visit:  Encounter for medication management  Hypertension goal BP (blood pressure) < 130/80 -     metoprolol tartrate (LOPRESSOR) 50 MG tablet; Take 1 tablet (50 mg total) by mouth daily. -     triamterene-hydrochlorothiazide (MAXZIDE-25) 37.5-25 MG tablet; Take 1 tablet by mouth daily.  Erectile dysfunction, unspecified erectile dysfunction type -     sildenafil (REVATIO) 20 MG tablet; Take 1 - 5 tablets PO as needed 30 minutes prior to sexual  activity  Mixed hyperlipidemia -     atorvastatin (LIPITOR) 20 MG tablet; Take 1 tablet (20 mg total) by mouth at bedtime.   BP out of range in office today. Patient did not take his medication this morning  Cont current medications  Patient education and anticipatory guidance given Patient agrees with treatment plan Follow-up in 4 months for med mgmt/fasting labs as needed if symptoms worsen or fail to improve  Levonne Hubertharley E. Alexzandrea Normington PA-C

## 2018-08-14 DIAGNOSIS — G4733 Obstructive sleep apnea (adult) (pediatric): Secondary | ICD-10-CM | POA: Diagnosis not present

## 2018-09-11 DIAGNOSIS — G4733 Obstructive sleep apnea (adult) (pediatric): Secondary | ICD-10-CM | POA: Diagnosis not present

## 2018-09-26 DIAGNOSIS — G4733 Obstructive sleep apnea (adult) (pediatric): Secondary | ICD-10-CM | POA: Diagnosis not present

## 2018-09-29 DIAGNOSIS — I10 Essential (primary) hypertension: Secondary | ICD-10-CM | POA: Diagnosis not present

## 2018-09-29 DIAGNOSIS — G4733 Obstructive sleep apnea (adult) (pediatric): Secondary | ICD-10-CM | POA: Diagnosis not present

## 2018-10-29 IMAGING — US US SOFT TISSUE HEAD/NECK
1 series · 12 of 12 positions shown · non-contrast
Comparison: None.

CLINICAL DATA: Left neck swelling

EXAM:
ULTRASOUND OF HEAD/NECK SOFT TISSUES
TECHNIQUE: Ultrasound examination of the head and neck soft tissues was
performed in the area of clinical concern.

[Series 1: us soft tissue head/neck · 0.06mm/px · 12 of 12 slices shown]
[im 1/12]
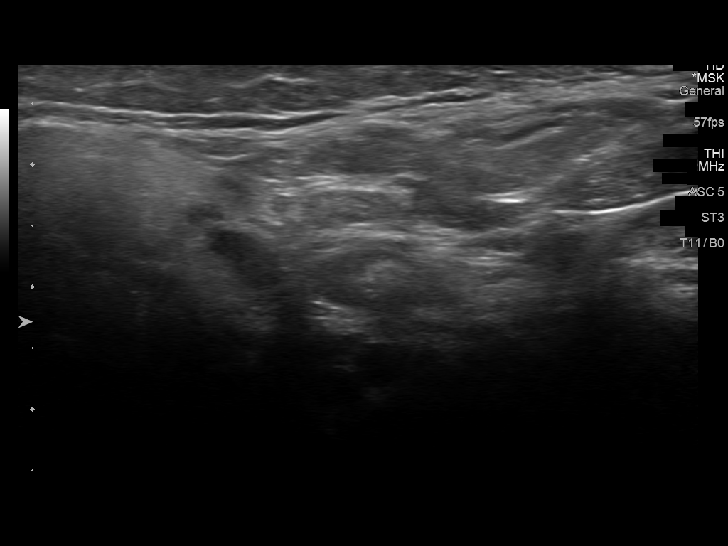
[im 2/12]
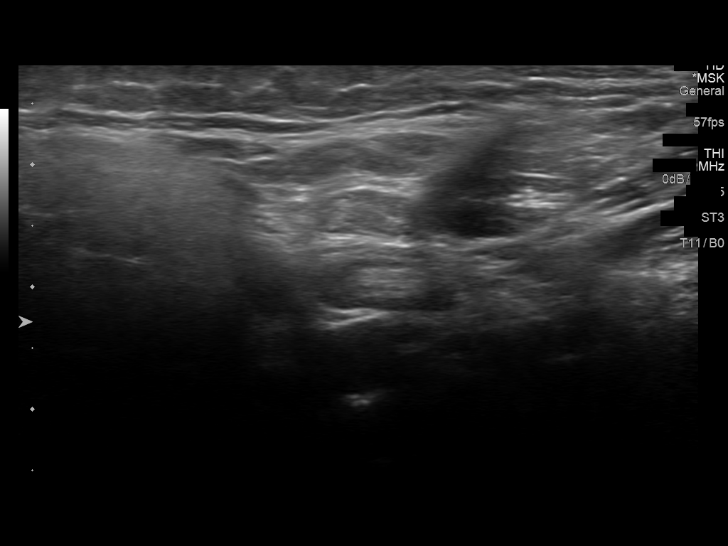
[im 3/12]
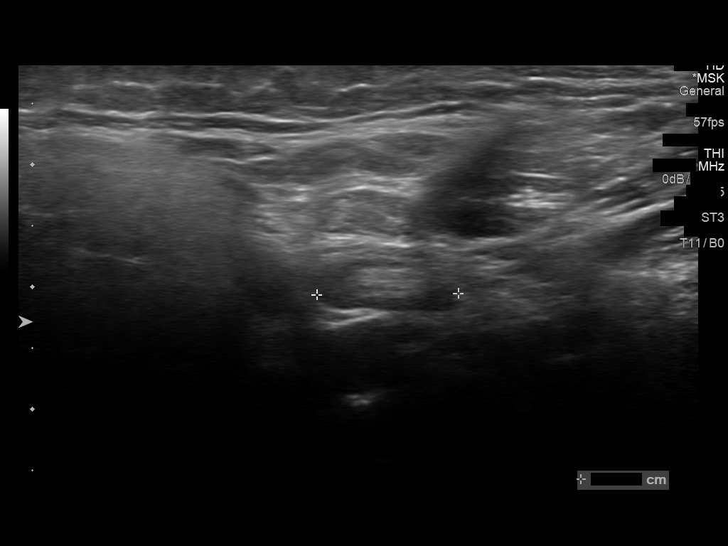
[im 4/12]
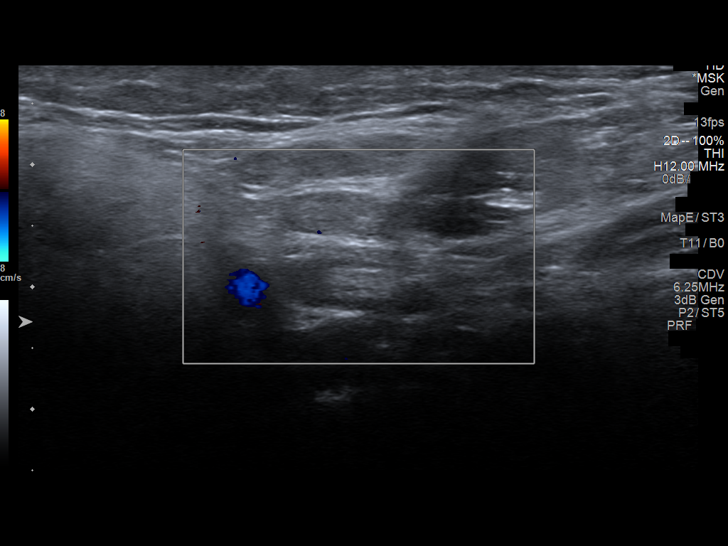
[im 5/12]
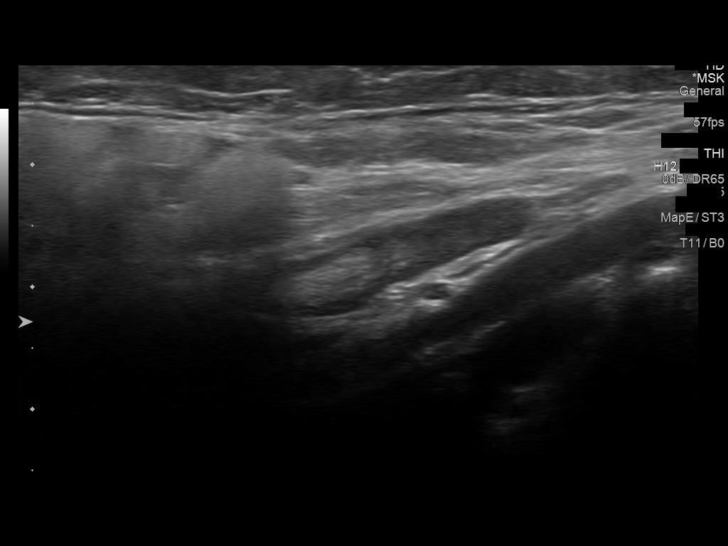
[im 6/12]
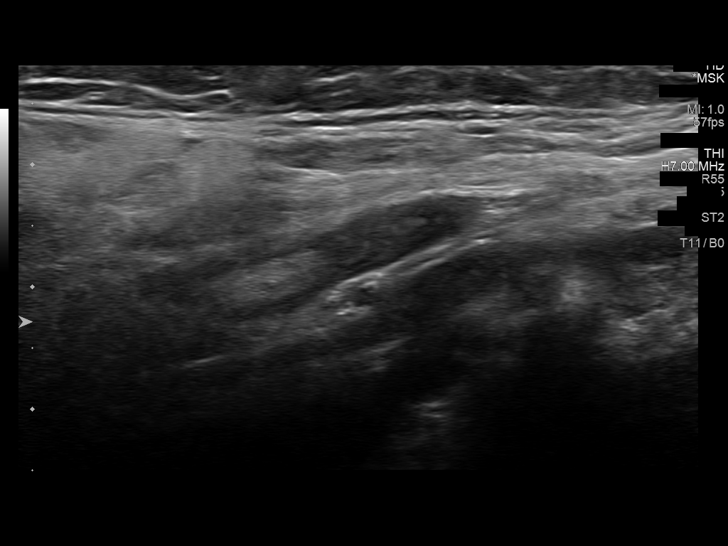
[im 7/12]
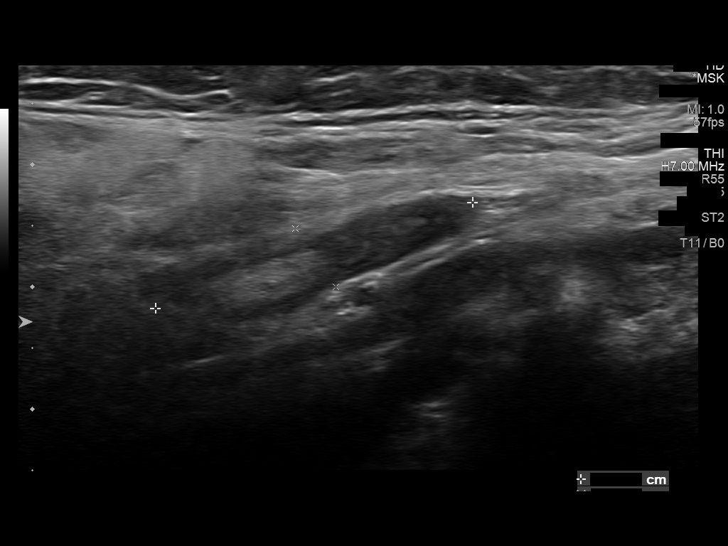
[im 8/12]
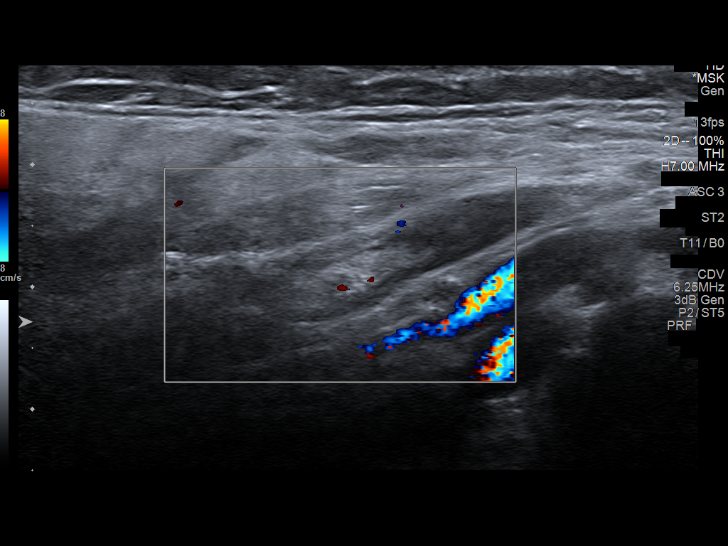
[im 9/12]
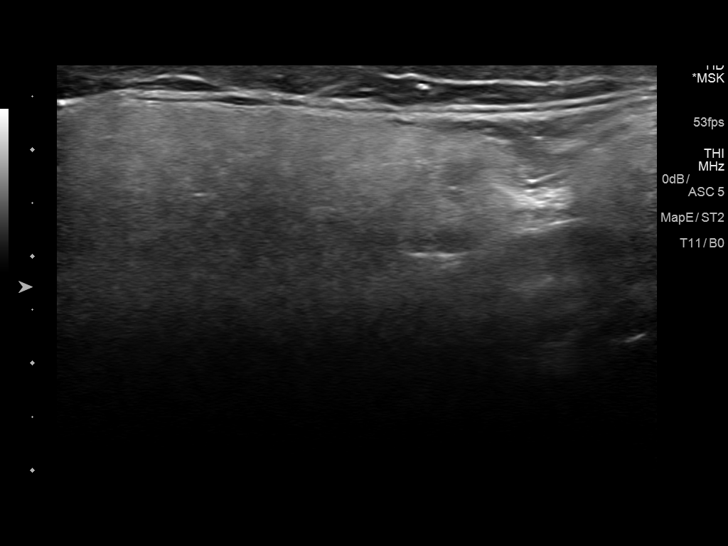
[im 10/12]
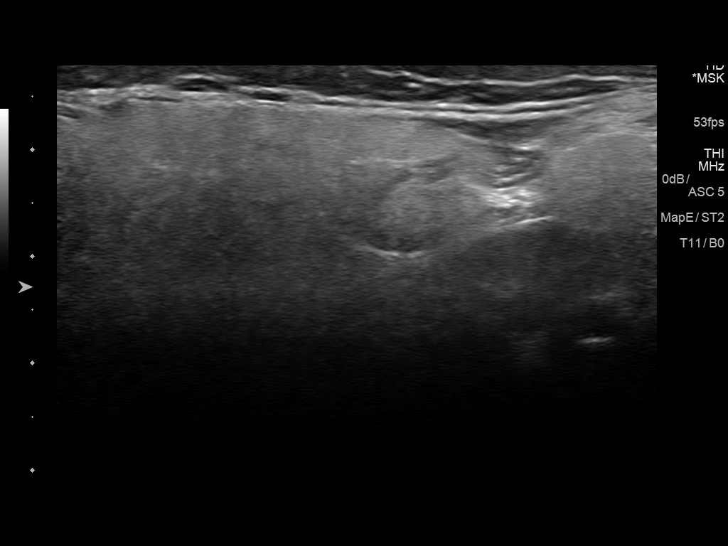
[im 11/12]
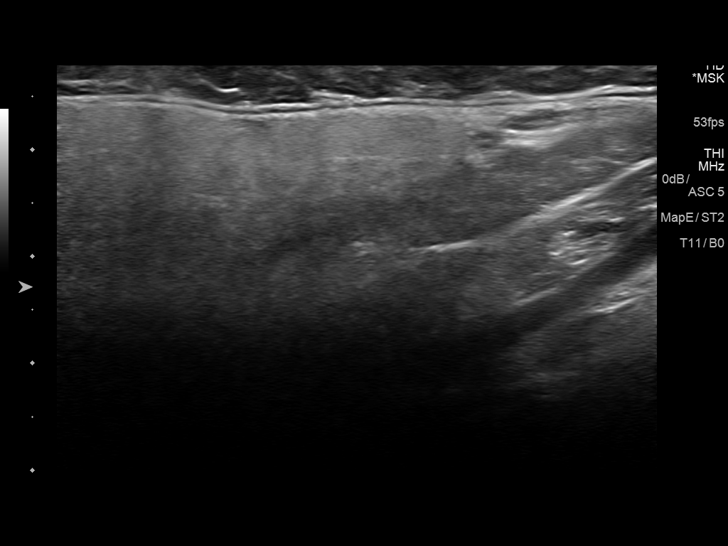
[im 12/12]
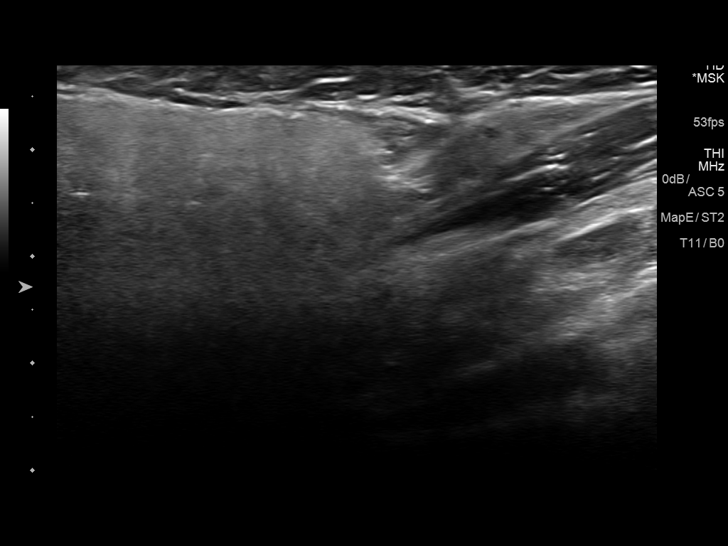

[12 of 12 positions shown; findings below may reference images not displayed]

FINDINGS: In the area of palpable concern, there is a lymph node adjacent to
the jugular vein. Short axis 6 mm. Normal appearing node with fatty
hilum. No mass lesion. No fluid collection.
IMPRESSION: 6 mm short axis diameter lymph node in the left mid neck corresponds
to the area of palpable concern. This is not pathologically
enlarged.

## 2018-12-18 ENCOUNTER — Other Ambulatory Visit: Payer: Self-pay | Admitting: Physician Assistant

## 2018-12-18 DIAGNOSIS — I10 Essential (primary) hypertension: Secondary | ICD-10-CM

## 2018-12-23 DIAGNOSIS — G4733 Obstructive sleep apnea (adult) (pediatric): Secondary | ICD-10-CM | POA: Diagnosis not present

## 2018-12-24 ENCOUNTER — Other Ambulatory Visit: Payer: Self-pay | Admitting: Physician Assistant

## 2018-12-24 DIAGNOSIS — I10 Essential (primary) hypertension: Secondary | ICD-10-CM

## 2018-12-31 ENCOUNTER — Ambulatory Visit (INDEPENDENT_AMBULATORY_CARE_PROVIDER_SITE_OTHER): Payer: BC Managed Care – PPO | Admitting: Physician Assistant

## 2018-12-31 ENCOUNTER — Encounter: Payer: Self-pay | Admitting: Physician Assistant

## 2018-12-31 DIAGNOSIS — Z125 Encounter for screening for malignant neoplasm of prostate: Secondary | ICD-10-CM

## 2018-12-31 DIAGNOSIS — Z5181 Encounter for therapeutic drug level monitoring: Secondary | ICD-10-CM

## 2018-12-31 DIAGNOSIS — I10 Essential (primary) hypertension: Secondary | ICD-10-CM | POA: Diagnosis not present

## 2018-12-31 DIAGNOSIS — E782 Mixed hyperlipidemia: Secondary | ICD-10-CM

## 2018-12-31 MED ORDER — ATORVASTATIN CALCIUM 20 MG PO TABS: 20.0000 mg | ORAL_TABLET | Freq: Every day | ORAL | 0 refills | Status: DC

## 2018-12-31 MED ORDER — TRIAMTERENE-HCTZ 37.5-25 MG PO TABS: 1.0000 | ORAL_TABLET | Freq: Every day | ORAL | 0 refills | Status: DC

## 2018-12-31 MED ORDER — METOPROLOL TARTRATE 50 MG PO TABS: 50.0000 mg | ORAL_TABLET | Freq: Two times a day (BID) | ORAL | 0 refills | Status: DC

## 2018-12-31 NOTE — Progress Notes (Signed)
Virtual Visit via Telephone Note  I connected with Andres Jordan on 12/31/18 at  8:10 AM EDT by telephone and verified that I am speaking with the correct person using two identifiers.   I discussed the limitations, risks, security and privacy concerns of performing an evaluation and management service by telephone and the availability of in person appointments. I also discussed with the patient that there may be a patient responsible charge related to this service. The patient expressed understanding and agreed to proceed.   History of Present Illness: HPI:                                                                Andres Jordan is a 63 y.o. male   CC: medication refills  HTN: taking Traimterene-HCTZ and Metoprolol daily. Compliant with medications. Does not check BP's at home. Denies vision change, headache, chest pain with exertion, orthopnea, lightheadedness, syncope and edema. Risk factors include: male sex, age>55, HLD, family history  OSA: on CPAP. Followed by Pulmonology.  He would like to be seen in the office for a skin check.  Past Medical History:  Diagnosis Date  . Erectile dysfunction   . Hyperlipidemia   . Hypertension   . Hypogonadism in male   . OSA on CPAP 10/01/2017   Past Surgical History:  Procedure Laterality Date  . NO PAST SURGERIES     Social History   Tobacco Use  . Smoking status: Never Smoker  . Smokeless tobacco: Never Used  Substance Use Topics  . Alcohol use: No    Frequency: Never   family history includes Aneurysm in his mother; Diabetes in his sister; Heart attack in his father; Hypertension in his sister.    ROS: negative except as noted in the HPI  Medications: Current Outpatient Medications  Medication Sig Dispense Refill  . aspirin EC 81 MG tablet Take 1 tablet (81 mg total) by mouth daily. 90 tablet 3  . atorvastatin (LIPITOR) 20 MG tablet Take 1 tablet (20 mg total) by mouth at bedtime. 90 tablet 1  . metoprolol tartrate  (LOPRESSOR) 50 MG tablet Take 1 tablet (50 mg total) by mouth daily. Due for follow up visit w/PCP 30 tablet 0  . triamterene-hydrochlorothiazide (MAXZIDE-25) 37.5-25 MG tablet Take 1 tablet by mouth daily. Due for follow up visit w/PCP 30 tablet 0  . sildenafil (REVATIO) 20 MG tablet Take 1 - 5 tablets PO as needed 30 minutes prior to sexual activity (Patient not taking: Reported on 12/31/2018) 50 tablet 2   No current facility-administered medications for this visit.    No Known Allergies     Objective:  There were no vitals taken for this visit. Pulm: Normal work of breathing, normal phonation Neuro: alert and oriented x 3 Psych: cooperative, euthymic mood, affect mood-congruent, speech is articulate, normal rate and volume; thought processes clear and goal-directed, normal judgment, good insight   BP Readings from Last 3 Encounters:  06/18/18 (!) 155/90  03/24/18 (!) 155/87  02/10/18 (!) 166/80   Pulse Readings from Last 3 Encounters:  06/18/18 65  03/24/18 63  02/10/18 65    09/29/2018 BP 140/78  No results found for this or any previous visit (from the past 72 hour(s)). No results found.    Assessment and  Plan: 63 y.o. male with   .[name] was seen today for medication management.  Diagnoses and all orders for this visit:  Medication monitoring encounter -     PSA -     COMPLETE METABOLIC PANEL WITH GFR -     CBC -     Lipid Panel w/reflex Direct LDL  Hypertension goal BP (blood pressure) < 130/80 -     COMPLETE METABOLIC PANEL WITH GFR -     CBC -     triamterene-hydrochlorothiazide (MAXZIDE-25) 37.5-25 MG tablet; Take 1 tablet by mouth daily. -     metoprolol tartrate (LOPRESSOR) 50 MG tablet; Take 1 tablet (50 mg total) by mouth 2 (two) times daily.  Mixed hyperlipidemia -     CBC -     Lipid Panel w/reflex Direct LDL -     atorvastatin (LIPITOR) 20 MG tablet; Take 1 tablet (20 mg total) by mouth at bedtime.  Screening PSA (prostate specific  antigen) -     PSA   Patient unable to provide home BP reading. Reviewed most recent BP reading from Pulmonology on 09/29/18. Increase Metoprolol 50 mg to bid dosing Overdue for routine labs Appt scheduled for next week for OV and fasting labs. Patient requested PSA check. Advised recommended no more than Q2Y, but patient wishes to monitor yearly Refills provided with the instruction to keep f/u appt next week   Follow Up Instructions:    I discussed the assessment and treatment plan with the patient. The patient was provided an opportunity to ask questions and all were answered. The patient agreed with the plan and demonstrated an understanding of the instructions.   The patient was advised to call back or seek an in-person evaluation if the symptoms worsen or if the condition fails to improve as anticipated.  I provided 5-10 minutes of non-face-to-face time during this encounter.   Trixie Dredge, [place]

## 2019-01-06 ENCOUNTER — Ambulatory Visit (INDEPENDENT_AMBULATORY_CARE_PROVIDER_SITE_OTHER): Payer: BC Managed Care – PPO | Admitting: Physician Assistant

## 2019-01-06 ENCOUNTER — Encounter: Payer: Self-pay | Admitting: Physician Assistant

## 2019-01-06 VITALS — BP 126/74 | HR 73 | Temp 98.3°F | Wt 196.0 lb

## 2019-01-06 DIAGNOSIS — Z125 Encounter for screening for malignant neoplasm of prostate: Secondary | ICD-10-CM | POA: Diagnosis not present

## 2019-01-06 DIAGNOSIS — L820 Inflamed seborrheic keratosis: Secondary | ICD-10-CM

## 2019-01-06 DIAGNOSIS — Z1283 Encounter for screening for malignant neoplasm of skin: Secondary | ICD-10-CM | POA: Diagnosis not present

## 2019-01-06 DIAGNOSIS — I10 Essential (primary) hypertension: Secondary | ICD-10-CM | POA: Diagnosis not present

## 2019-01-06 DIAGNOSIS — H01004 Unspecified blepharitis left upper eyelid: Secondary | ICD-10-CM | POA: Insufficient documentation

## 2019-01-06 DIAGNOSIS — Z5181 Encounter for therapeutic drug level monitoring: Secondary | ICD-10-CM | POA: Diagnosis not present

## 2019-01-06 DIAGNOSIS — L304 Erythema intertrigo: Secondary | ICD-10-CM | POA: Diagnosis not present

## 2019-01-06 DIAGNOSIS — E782 Mixed hyperlipidemia: Secondary | ICD-10-CM | POA: Diagnosis not present

## 2019-01-06 MED ORDER — FLUCONAZOLE 200 MG PO TABS: 200.0000 mg | ORAL_TABLET | ORAL | 0 refills | Status: DC

## 2019-01-06 MED ORDER — NYSTATIN 100000 UNIT/GM EX POWD: Freq: Two times a day (BID) | CUTANEOUS | 0 refills | Status: DC

## 2019-01-06 MED ORDER — HYDROCORTISONE 2.5 % EX OINT
TOPICAL_OINTMENT | Freq: Two times a day (BID) | CUTANEOUS | 0 refills | Status: DC | PRN
Start: 2019-01-06 — End: 2019-01-06

## 2019-01-06 MED ORDER — HYDROCORTISONE 2.5 % EX OINT: TOPICAL_OINTMENT | Freq: Two times a day (BID) | CUTANEOUS | 0 refills | Status: AC | PRN

## 2019-01-06 NOTE — Patient Instructions (Addendum)
Blepharitis Blepharitis is swelling of the eyelids. Symptoms may include:  Reddish, scaly skin around the scalp and eyebrows.  Burning or itching of the eyelids.  Fluid coming from the eye at night. This causes the eyelashes to stick together in the morning.  Eyelashes that fall out.  Being sensitive to light. Follow these instructions at home: Pay attention to any changes in how you look or feel. Tell your health care provider about any changes. Follow these instructions to help with your condition: Keeping clean   Wash your hands often.  Wash your eyelids with warm water, or wash them with warm water that is mixed with little bit of baby shampoo. Do this 2 or more times per day.  Wash your face and eyebrows at least once a day.  Use a clean towel each time you dry your eyelids. Do not use the towel to clean or dry other areas of your body. Do not share your towel with anyone. General instructions  Avoid wearing makeup until you get better. Do not share makeup with anyone.  Avoid rubbing your eyes.  Put a warm compress on your eyes 2 times per day for 10 minutes at a time, or as told by your doctor.  If you were given antibiotics in the form of creams or eye drops, use the medicine as told by your doctor. Do not stop using the medicine even if you feel better.  Keep all follow-up visits as told by your doctor. This is important. Contact a doctor if:  Your eyelids feel hot.  You have blisters on your eyelids.  You have a rash on your eyelids.  The swelling does not go away in 2-4 days.  The swelling gets worse. Get help right away if:  You have pain that gets worse.  You have pain that spreads to other parts of your face.  You have redness that gets worse.  You have redness that spreads to other parts of your face.  Your vision changes.  You have pain when you look at lights or things that move.  You have a fever. Summary  Blepharitis is swelling of the  eyelids.  Pay attention to any changes in how your eyes look or feel. Tell your doctor about any changes.  Follow home care instructions as told by your doctor. Wash your hands often. Avoid wearing makeup. Do not rub your eyes.  Use warm compresses, creams, or eye drops as told by your doctor.  Let your doctor know if you have changes in vision, blisters or rash on eyelids, pain that spreads to your face, or warmth on your eyelids. This information is not intended to replace advice given to you by your health care provider. Make sure you discuss any questions you have with your health care provider. Document Released: 04/17/2008 Document Revised: 01/06/2018 Document Reviewed: 01/06/2018 Elsevier Interactive Patient Education  2019 Elsevier Inc.   Preventing Skin Cancer, Adult Skin cancer is the most common type of cancer. There are three main types. Squamous cell and basal cell skin cancer are the most common. Melanoma skin cancer is the most dangerous type. Most skin cancers are caused by skin damage from exposure to ultraviolet (UV) light. UV light comes from the sun and from artificial tanning beds. Suntans and sunburns result from exposure to UV light. Skin cancer occurs most often in older people, but it is usually the result of damage done earlier in life. The tans and sunburns you get at any age  can lead to skin cancer in the future. To help prevent this, you can take steps to protect yourself. What actions can I take to protect myself from skin cancer? Many people like to get a tan, especially in the summer or when on vacation. However, tan or burned skin is a sign of skin damage. It increases your risk for skin cancer. To lower your risk: Avoid exposure to UV light   Try to stay out of the sun between 10 a.m. and 4 p.m. whenever possible. This is when the sun is at its strongest. Seek the shade during this time.  Remember that you can also be exposed to UV rays on cloudy or hazy  days. Nancy Fetter exposure can be risky year-round, not just in the summer.  Do not use a sunlamp, tanning bed, or tanning booth to get a tan. If you really want a tan, use an artificial tanning lotion.  Avoid getting sunburned. Sunburns are more common on bright sunny days, especially when you are in areas where the sun is reflected off water or snow. Use sunscreen and protective clothing   Always use sunscreen-either a cream, lotion, or spray-when you are out in the sun. Keep sunscreen handy, such as in your gym bag or in your car, so that you will have it when you need it.  Use a sunscreen with a sun protection factor (SPF) of at least 24. Use an SPF of 30 or higher if you are in bright sun, especially when you are out in the snow or on the water.  Make sure your sunscreen protects you from UVA and UVB light.  Use an adequate amount of sunscreen to cover exposed areas of skin. Put it on 30 minutes before you go out. Reapply it every 2 hours or anytime you come out of the water.  When you are out in the sun, wear a broad-brimmed hat and clothing that covers your arms and legs. Wear wraparound sunglasses. Check your skin for changes  Check your skin often from head to toe to look for any changes in the size, color, or shape of any moles or freckles. Check for any new moles or moles that bleed or become itchy. See your health care provider if you notice changes.  Ask your health care provider about a total skin check. Ask if it should be part of your yearly physical or if you need to see a skin specialist (dermatologist). Take other preventive measures   Avoid exposure to harmful chemicals, such as arsenic. ? Have your home's water tested for arsenic and other chemicals. ? Take protective measures to avoid exposure to chemicals at work.  Do not smoke any tobacco products, such as cigarettes, cigars, pipes, and e-cigarettes. If you need help quitting, ask your health care provider.  Keep your  immune system healthy. ? Stay up to date on all vaccines, including the human papillomavirus (HPV) vaccine. ? Eat at least 5 servings of fruits and vegetables every day. Why are these changes important? About 1 of every 5 people will get skin cancer. The best way to reduce your risk is to avoid skin damage from UV light. If you have teenagers in your house, they should know that just five bad sunburns as a teen could double their risk of skin cancer in the future. If you have younger children, always make sure to protect their skin from the sun. These changes can help reduce your risk of skin cancer, and they will also  provide other health benefits, such as the following:  Protecting your skin from the sun can help prevent painful sunburns, sun poisoning, and other skin damage and blemishes. This is especially important if: ? You have pale white skin, freckles, and red hair. ? You burn easily.  Avoiding exposure to harmful chemicals can help prevent damage to other tissues in your body, such as your lungs, and prevent other types of cancer.  Avoiding smoking tobacco can reduce your risk for other types of cancer and other health problems.  Eating a healthy diet is good for your overall health. What can happen if changes are not made? If you do not make these changes, you will be at higher risk for skin cancer. If you develop skin cancer, the treatments could result in lost time from work and changes in your appearance from scars. The most dangerous type of skin cancer, melanoma, can be deadly if not found early. Where to find support For more support, talk to your primary health care provider or dermatologist. Where to find more information Learn more about skin cancer from:  The Skin Cancer Foundation: www.skincancer.org/prevention  The Centers for Disease Control and Prevention: LatePreviews.co.ukwww.cdc.gov/cancer/skin/  The American Academy of Dermatology: InfoExam.siwww.aad.org Summary  Skin cancer is the most  common type of cancer.  Melanoma skin cancer can be deadly if not found early.  Sunburns and tanning increase your risk for skin cancer.  Protecting your skin from UV light is the best way to prevent skin cancer. This information is not intended to replace advice given to you by your health care provider. Make sure you discuss any questions you have with your health care provider. Document Released: 09/02/2017 Document Revised: 09/02/2017 Document Reviewed: 09/02/2017 Elsevier Interactive Patient Education  2019 [employer]   Intertrigo Intertrigo is skin irritation (inflammation) that happens in warm, moist areas of the body. The irritation can cause a rash and make skin raw and itchy. The rash is usually pink or red. It happens mostly between folds of skin or where skin rubs together, such as:  Between the toes.  In the armpits.  In the groin area.  Under the belly.  Under the breasts.  Around the butt area. This condition is not passed from person to person (is not contagious). What are the causes?  Heat, moisture, rubbing, and not enough air movement.  The condition can be made worse by: ? Sweat. ? Bacteria. ? A fungus, such as yeast. What increases the risk?  Moisture in your skin folds.  You are more likely to develop this condition if you: ? Have diabetes. ? Are overweight. ? Are not able to move around. ? Live in a warm and moist climate. ? Wear splints, braces, or other medical devices. ? Are not able to control your pee (urine) or poop (stool). What are the signs or symptoms?  A pink or red skin rash in the skin fold or near the skin fold.  Raw or scaly skin.  Itching.  A burning feeling.  Bleeding.  Leaking fluid.  A bad smell. How is this treated?  Cleaning and drying your skin.  Taking an antibiotic medicine or using an antibiotic skin cream for a bacterial infection.  Using an antifungal cream on your skin or taking pills for an  infection that was caused by a fungus, such as yeast.  Using a steroid ointment to stop the itching and irritation.  Separating the skin fold with a clean cotton cloth to absorb moisture  and allow air to flow into the area. Follow these instructions at home:  Keep the affected area clean and dry.  Do not scratch your skin.  Stay cool as much as you can. Use an air conditioner or a fan, if you have one.  Apply over-the-counter and prescription medicines only as told by your doctor.  If you were prescribed an antibiotic medicine, use it as told by your doctor. Do not stop using the antibiotic even if your condition starts to get better.  Keep all follow-up visits as told by your doctor. This is important. How is this prevented?   Stay at a healthy weight.  Take care of your feet. This is very important if you have diabetes. You should: ? Wear shoes that fit well. ? Keep your feet dry. ? Wear clean cotton or wool socks.  Protect the skin in your groin and butt area as told by your doctor. To do this: ? Follow a regular cleaning routine. ? Use creams, powders, or ointments that protect your skin. ? Change protection pads often.  Do not wear tight clothes. Wear clothes that: ? Are loose. ? Take moisture away from your body. ? Are made of cotton.  Wear a bra that gives good support, if needed.  Shower and dry yourself well after being active. Use a hair dryer on a cool setting to dry between skin folds.  Keep your blood sugar under control if you have diabetes. Contact a doctor if:  Your symptoms do not get better with treatment.  Your symptoms get worse or they spread.  You notice more redness and warmth.  You have a fever. Summary  Intertrigo is skin irritation that occurs when folds of skin rub together.  This condition is caused by heat, moisture, and rubbing.  This condition may be treated by cleaning and drying your skin and with medicines.  Apply  over-the-counter and prescription medicines only as told by your doctor.  Keep all follow-up visits as told by your doctor. This is important. This information is not intended to replace advice given to you by your health care provider. Make sure you discuss any questions you have with your health care provider. Document Released: 08/11/2010 Document Revised: 12/09/2017 Document Reviewed: 01/10/2015 Elsevier Interactive Patient Education  2019 [employer]

## 2019-01-06 NOTE — Progress Notes (Signed)
HPI:                                                                Lavell AnchorsJose Casillas is a 63 y.o. male who [facility] today for skin check  Patient reports rash on his left upper eyelid present for approx 1-2 weeks. It is asymptomatic. Denies eyelid swelling or tenderness. Denies eye drainage.  He also reports longstanding rash of his left inguinal region. Has been using an OTC antifungal cream off and on for years.  Denies personal or family hx of skin cancer.  Past Medical History:  Diagnosis Date  . Erectile dysfunction   . Hyperlipidemia   . Hypertension   . Hypogonadism in male   . OSA on CPAP 10/01/2017   Past Surgical History:  Procedure Laterality Date  . NO PAST SURGERIES     Social History   Tobacco Use  . Smoking status: Never Smoker  . Smokeless tobacco: Never Used  Substance Use Topics  . Alcohol use: No    Frequency: Never   family history includes Aneurysm in his mother; Diabetes in his sister; Heart attack in his father; Hypertension in his sister.    ROS: negative except as noted in the HPI  Medications: Current Outpatient Medications  Medication Sig Dispense Refill  . aspirin EC 81 MG tablet Take 1 tablet (81 mg total) by mouth daily. 90 tablet 3  . atorvastatin (LIPITOR) 20 MG tablet Take 1 tablet (20 mg total) by mouth at bedtime. 90 tablet 0  . metoprolol tartrate (LOPRESSOR) 50 MG tablet Take 1 tablet (50 mg total) by mouth 2 (two) times daily. 180 tablet 0  . sildenafil (REVATIO) 20 MG tablet Take 1 - 5 tablets PO as needed 30 minutes prior to sexual activity 50 tablet 2  . triamterene-hydrochlorothiazide (MAXZIDE-25) 37.5-25 MG tablet Take 1 tablet by mouth daily. 90 tablet 0   No current facility-administered medications for this visit.    No Known Allergies     Objective:  BP 126/74   Pulse 73   Temp 98.3 F (36.8 C) (Oral)   Wt 196 lb (88.9 kg)   BMI 28.12 kg/m  Gen:   alert, not ill-appearing, no distress, appropriate for age HEENT: head normocephalic without obvious abnormality, conjunctiva and cornea clear, left upper eyelid there are several tiny pearly papules without erythema or scale, no eyelid edema, no eyelash debris, trachea midline Pulm: Normal work of breathing, normal phonation Head-to-toe skin examination was performed and areas of sun damage are noted with scattered lentigines, brown flat macules, pigmented lesions with regular borders are noted Left inguinal region there is a beefy red patch with an approx 2 cm area of lichenified, grayish plaque    No results found for this or any previous visit (from the past 72 hour(s)). No results found.    Assessment and Plan: 63 y.o. male with   .Gervase was seen today for medication management.  Diagnoses and all orders for this visit:  Skin exam, screening for cancer  Blepharitis of left upper eyelid, unspecified type -     Discontinue: hydrocortisone 2.5 % ointment; Apply topically 2 (two) times daily as needed for up to 7 days. Do not use for more  than 14 consecutive days on same area. Avoid contact with eye -     hydrocortisone 2.5 % ointment; Apply topically 2 (two) times daily as needed for up to 7 days. Cycle use 7 days on, 7 days off  Intertrigo -     nystatin (MYCOSTATIN/NYSTOP) powder; Apply topically 2 (two) times daily. Apply to inguinal folds -     fluconazole (DIFLUCAN) 200 MG tablet; Take 1 tablet (200 mg total) by mouth once a week.  Seborrheic keratoses, inflamed       Patient education and anticipatory guidance given Patient agrees with treatment plan Follow-up in 2 weeks for rash or sooner as needed if symptoms worsen or fail to improve  Darlyne Russian PA-C

## 2019-01-07 LAB — COMPLETE METABOLIC PANEL WITH GFR
AG Ratio: 1.8 (calc) (ref 1.0–2.5)
ALT: 30 U/L (ref 9–46)
AST: 22 U/L (ref 10–35)
Albumin: 4.3 g/dL (ref 3.6–5.1)
Alkaline phosphatase (APISO): 104 U/L (ref 35–144)
BUN: 18 mg/dL (ref 7–25)
CO2: 29 mmol/L (ref 20–32)
Calcium: 10 mg/dL (ref 8.6–10.3)
Chloride: 100 mmol/L (ref 98–110)
Creat: 1.1 mg/dL (ref 0.70–1.25)
GFR, Est African American: 82 mL/min/{1.73_m2} (ref >=60)
GFR, Est Non African American: 71 mL/min/{1.73_m2} (ref >=60)
Globulin: 2.4 g/dL (calc) (ref 1.9–3.7)
Glucose, Bld: 103 mg/dL — ABNORMAL HIGH (ref 65–99)
Potassium: 3.6 mmol/L (ref 3.5–5.3)
Sodium: 140 mmol/L (ref 135–146)
Total Bilirubin: 0.5 mg/dL (ref 0.2–1.2)
Total Protein: 6.7 g/dL (ref 6.1–8.1)

## 2019-01-07 LAB — CBC
HCT: 50 % (ref 38.5–50.0)
Hemoglobin: 17.2 g/dL — ABNORMAL HIGH (ref 13.2–17.1)
MCH: 31.4 pg (ref 27.0–33.0)
MCHC: 34.4 g/dL (ref 32.0–36.0)
MCV: 91.4 fL (ref 80.0–100.0)
MPV: 10.6 fL (ref 7.5–12.5)
Platelets: 237 10*3/uL (ref 140–400)
RBC: 5.47 10*6/uL (ref 4.20–5.80)
RDW: 12.7 % (ref 11.0–15.0)
WBC: 5.6 10*3/uL (ref 3.8–10.8)

## 2019-01-07 LAB — LIPID PANEL W/REFLEX DIRECT LDL
Cholesterol: 188 mg/dL (ref <200)
HDL: 46 mg/dL (ref >=40)
LDL Cholesterol (Calc): 113 mg/dL (calc) — ABNORMAL HIGH
Non-HDL Cholesterol (Calc): 142 mg/dL (calc) — ABNORMAL HIGH (ref <130)
Total CHOL/HDL Ratio: 4.1 (calc) (ref <5.0)
Triglycerides: 172 mg/dL — ABNORMAL HIGH (ref <150)

## 2019-01-07 LAB — PSA: PSA: 2.3 ng/mL (ref <=4.0)

## 2019-01-09 ENCOUNTER — Encounter: Payer: Self-pay | Admitting: Physician Assistant

## 2019-01-09 DIAGNOSIS — R7301 Impaired fasting glucose: Secondary | ICD-10-CM | POA: Insufficient documentation

## 2019-01-09 HISTORY — DX: Impaired fasting glucose: R73.01

## 2019-01-29 DIAGNOSIS — M19072 Primary osteoarthritis, left ankle and foot: Secondary | ICD-10-CM | POA: Diagnosis not present

## 2019-01-29 DIAGNOSIS — M19071 Primary osteoarthritis, right ankle and foot: Secondary | ICD-10-CM | POA: Diagnosis not present

## 2019-01-29 DIAGNOSIS — M7752 Other enthesopathy of left foot: Secondary | ICD-10-CM | POA: Diagnosis not present

## 2019-01-29 DIAGNOSIS — M7751 Other enthesopathy of right foot: Secondary | ICD-10-CM | POA: Diagnosis not present

## 2019-01-29 DIAGNOSIS — M67372 Transient synovitis, left ankle and foot: Secondary | ICD-10-CM | POA: Diagnosis not present

## 2019-01-29 DIAGNOSIS — M109 Gout, unspecified: Secondary | ICD-10-CM | POA: Diagnosis not present

## 2019-01-29 DIAGNOSIS — M67371 Transient synovitis, right ankle and foot: Secondary | ICD-10-CM | POA: Diagnosis not present

## 2019-02-02 DIAGNOSIS — M109 Gout, unspecified: Secondary | ICD-10-CM | POA: Diagnosis not present

## 2019-02-02 DIAGNOSIS — M7751 Other enthesopathy of right foot: Secondary | ICD-10-CM | POA: Diagnosis not present

## 2019-02-02 DIAGNOSIS — M7752 Other enthesopathy of left foot: Secondary | ICD-10-CM | POA: Diagnosis not present

## 2019-02-17 ENCOUNTER — Other Ambulatory Visit: Payer: Self-pay

## 2019-02-17 ENCOUNTER — Ambulatory Visit (INDEPENDENT_AMBULATORY_CARE_PROVIDER_SITE_OTHER): Payer: BC Managed Care – PPO | Admitting: Physician Assistant

## 2019-02-17 VITALS — BP 154/88 | HR 73 | Temp 98.6°F

## 2019-02-17 DIAGNOSIS — L304 Erythema intertrigo: Secondary | ICD-10-CM

## 2019-02-17 DIAGNOSIS — B356 Tinea cruris: Secondary | ICD-10-CM

## 2019-02-17 MED ORDER — CLOTRIMAZOLE-BETAMETHASONE 1-0.05 % EX CREA: 1.0000 "application " | TOPICAL_CREAM | Freq: Two times a day (BID) | CUTANEOUS | 2 refills | Status: DC

## 2019-02-17 MED ORDER — ERYTHROMYCIN 250 MG PO TBEC: 500.0000 mg | DELAYED_RELEASE_TABLET | Freq: Two times a day (BID) | ORAL | 0 refills | Status: AC

## 2019-02-17 NOTE — Progress Notes (Signed)
HPI:                                                                Andres Jordan is a 63 y.o. male who presents to [facility] [place] today for rash follow-up  He also reports longstanding rash of his left inguinal region. Has been using an OTC antifungal cream off and on for years. He was treated 1 month ago for intertrigo with Diflucan and Nystatin powder. He reports no change in the rash.  Reports eyelid rash resolved.   Past Medical History:  Diagnosis Date  . Erectile dysfunction   . Fasting hyperglycemia 01/09/2019  . Hyperlipidemia   . Hypertension   . Hypogonadism in male   . OSA on CPAP 10/01/2017   Past Surgical History:  Procedure Laterality Date  . NO PAST SURGERIES     Social History   Tobacco Use  . Smoking status: Never Smoker  . Smokeless tobacco: Never Used  Substance Use Topics  . Alcohol use: No    Frequency: Never   family history includes Aneurysm in his mother; Diabetes in his sister; Heart attack in his father; Hypertension in his sister.    ROS: negative except as noted in the HPI  Medications: Current Outpatient Medications  Medication Sig Dispense Refill  . aspirin EC 81 MG tablet Take 1 tablet (81 mg total) by mouth daily. 90 tablet 3  . atorvastatin (LIPITOR) 20 MG tablet Take 1 tablet (20 mg total) by mouth at bedtime. 90 tablet 0  . clotrimazole-betamethasone (LOTRISONE) cream Apply 1 application topically 2 (two) times daily for 14 days. Apply a thin film to the affected skin area, including 1/2 inch of surrounding healthy skin,  twice daily for 2 weeks 45 g 2  . Erythromycin 250 MG TBEC Take 500 mg by mouth 2 (two) times a day for 7 days. 28 tablet 0  . metoprolol tartrate (LOPRESSOR) 50 MG tablet Take 1 tablet (50 mg total) by mouth 2 (two) times daily. 180 tablet 0  . triamterene-hydrochlorothiazide (MAXZIDE-25) 37.5-25 MG tablet Take 1 tablet by mouth daily. 90 tablet 0   No current  facility-administered medications for this visit.    No Known Allergies     Objective:  BP (!) 154/88   Pulse 73   Temp 98.6 F (37 C) (Oral)   Vitals:   02/17/19 1049  BP: (!) 154/88  Pulse: 73  Temp: 98.6 F (37 C)    Gen:  alert, not ill-appearing, no distress, appropriate for age 35: head normocephalic without obvious abnormality, conjunctiva and cornea clear, left upper eyelid without erythema or scale, no eyelid edema, no eyelash debris, trachea midline Pulm: Normal work of breathing, normal phonation Skin: deferred per patient     No results found for this or any previous visit (from the past 72 hour(s)). No results found.    Assessment and Plan: 63 y.o. male with   .Andres Jordan was seen today for rash.  Diagnoses and all orders for this visit:  Tinea cruris -     clotrimazole-betamethasone (LOTRISONE) cream; Apply 1 application topically 2 (two) times daily for 14 days. Apply a thin film to the affected skin area, including 1/2 inch of surrounding healthy skin,  twice daily  for 2 weeks  Intertrigo -     Erythromycin 250 MG TBEC; Take 500 mg by mouth 2 (two) times a day for 7 days.   Intertriginous rash, chronic >1 year Failed oral fluconazole and nystatin powder Will treat for erythrasma with Erytromycin and tinea with Lotrisone D/C Nystatin Counseled on general measures   Patient education and anticipatory guidance given Patient agrees with treatment plan Follow-up in 2 weeks for rash or sooner as needed if symptoms worsen or fail to improve  Levonne Hubertharley E. Delayni Streed PA-C

## 2019-02-17 NOTE — Patient Instructions (Signed)
Intertrigo Intertrigo is skin irritation or inflammation (dermatitis) that occurs when folds of skin rub together. The irritation can cause a rash and make skin raw and itchy. This condition most commonly occurs in the skin folds of these areas:  Toes.  Armpits.  Groin.  Under the belly.  Under the breasts.  Buttocks. Intertrigo is not passed from person to person (is not contagious). What are the causes? This condition is caused by heat, moisture, rubbing (friction), and not enough air circulation. The condition can be made worse by:  Sweat.  Bacteria.  A fungus, such as yeast. What increases the risk? This condition is more likely to occur if you have moisture in your skin folds. You are more likely to develop this condition if you:  Have diabetes.  Are overweight.  Are not able to move around or are not active.  Live in a warm and moist climate.  Wear splints, braces, or other medical devices.  Are not able to control your bowels or bladder (have incontinence). What are the signs or symptoms? Symptoms of this condition include:  A pink or red skin rash in the skin fold or near the skin fold.  Raw or scaly skin.  Itchiness.  A burning feeling.  Bleeding.  Leaking fluid.  A bad smell. How is this diagnosed? This condition is diagnosed with a medical history and physical exam. You may also have a skin swab to test for bacteria or a fungus. How is this treated? This condition may be treated by:  Cleaning and drying your skin.  Taking an antibiotic medicine or using an antibiotic skin cream for a bacterial infection.  Using an antifungal cream on your skin or taking pills for an infection that was caused by a fungus, such as yeast.  Using a steroid ointment to relieve itchiness and irritation.  Separating the skin fold with a clean cotton cloth to absorb moisture and allow air to flow into the area. Follow these instructions at home:  Keep the  affected area clean and dry.  Do not scratch your skin.  Stay in a cool environment as much as possible. Use an air conditioner or fan, if available.  Apply over-the-counter and prescription medicines only as told by your health care provider.  If you were prescribed an antibiotic medicine, use it as told by your health care provider. Do not stop using the antibiotic even if your condition improves.  Keep all follow-up visits as told by your health care provider. This is important. How is this prevented?   Maintain a healthy weight.  Take care of your feet, especially if you have diabetes. Foot care includes: ? Wearing shoes that fit well. ? Keeping your feet dry. ? Wearing clean, breathable socks.  Protect the skin around your groin and buttocks, especially if you have incontinence. Skin protection includes: ? Following a regular cleaning routine. ? Using skin protectant creams, powders, or ointments. ? Changing protection pads frequently.  Do not wear tight clothes. Wear clothes that are loose, absorbent, and made of cotton.  Wear a bra that gives good support, if needed.  Shower and dry yourself well after activity or exercise. Use a hair dryer on a cool setting to dry between skin folds, especially after you bathe.  If you have diabetes, keep your blood sugar under control. Contact a health care provider if:  Your symptoms do not improve with treatment.  Your symptoms get worse or they spread.  You notice increased redness and   warmth.  You have a fever. Summary  Intertrigo is skin irritation or inflammation (dermatitis) that occurs when folds of skin rub together.  This condition is caused by heat, moisture, rubbing (friction), and not enough air circulation.  This condition may be treated by cleaning and drying your skin and with medicines.  Apply over-the-counter and prescription medicines only as told by your health care provider.  Keep all follow-up visits  as told by your health care provider. This is important. This information is not intended to replace advice given to you by your health care provider. Make sure you discuss any questions you have with your health care provider. Document Released: 07/09/2005 Document Revised: 12/09/2017 Document Reviewed: 12/09/2017 Elsevier Patient Education  2020 Elsevier Inc.  

## 2019-02-28 ENCOUNTER — Encounter: Payer: Self-pay | Admitting: Physician Assistant

## 2019-03-13 ENCOUNTER — Ambulatory Visit (INDEPENDENT_AMBULATORY_CARE_PROVIDER_SITE_OTHER): Payer: BC Managed Care – PPO | Admitting: Physician Assistant

## 2019-03-13 ENCOUNTER — Encounter: Payer: Self-pay | Admitting: Physician Assistant

## 2019-03-13 ENCOUNTER — Other Ambulatory Visit: Payer: Self-pay

## 2019-03-13 VITALS — BP 131/73 | HR 63 | Temp 97.4°F | Ht 70.0 in | Wt 197.0 lb

## 2019-03-13 DIAGNOSIS — L304 Erythema intertrigo: Secondary | ICD-10-CM

## 2019-03-13 DIAGNOSIS — Z23 Encounter for immunization: Secondary | ICD-10-CM

## 2019-03-13 DIAGNOSIS — B356 Tinea cruris: Secondary | ICD-10-CM

## 2019-03-13 MED ORDER — CLOTRIMAZOLE-BETAMETHASONE 1-0.05 % EX CREA: 1.0000 "application " | TOPICAL_CREAM | Freq: Two times a day (BID) | CUTANEOUS | 2 refills | Status: DC

## 2019-03-13 NOTE — Progress Notes (Signed)
Pt reports that it is actually a fungus and he used the cream that was given to him and it cleared this up. Maryruth Eve, Lahoma Crocker, CMA

## 2019-03-13 NOTE — Progress Notes (Signed)
HPI:                                                                Andres Jordan is a 63 y.o. male who presents to [facility]: [place] today for rash follow-up  He also reports longstanding rash of his left inguinal region. Has been using an OTC antifungal cream off and on for years. He was treated 1 month ago for intertrigo with Diflucan and Nystatin powder. He reports no change in the rash.   At last office visit he was treated for intertrigo with Erythromycin and Lotrisone. He reports symptoms resolved after 1 week and he has not had any recurrent symptoms.   Past Medical History:  Diagnosis Date  . Erectile dysfunction   . Fasting hyperglycemia 01/09/2019  . Hyperlipidemia   . Hypertension   . Hypogonadism in male   . OSA on CPAP 10/01/2017   Past Surgical History:  Procedure Laterality Date  . NO PAST SURGERIES     Social History   Tobacco Use  . Smoking status: Never Smoker  . Smokeless tobacco: Never Used  Substance Use Topics  . Alcohol use: No    Frequency: Never   family history includes Aneurysm in his mother; Diabetes in his sister; Heart attack in his father; Hypertension in his sister.    ROS: negative except as noted in the HPI  Medications: Current Outpatient Medications  Medication Sig Dispense Refill  . atorvastatin (LIPITOR) 20 MG tablet Take 1 tablet (20 mg total) by mouth at bedtime. 90 tablet 0  . metoprolol tartrate (LOPRESSOR) 50 MG tablet Take 1 tablet (50 mg total) by mouth 2 (two) times daily. 180 tablet 0  . triamterene-hydrochlorothiazide (MAXZIDE-25) 37.5-25 MG tablet Take 1 tablet by mouth daily. 90 tablet 0  . aspirin EC 81 MG tablet Take 1 tablet (81 mg total) by mouth daily. 90 tablet 3   No current facility-administered medications for this visit.    No Known Allergies     Objective:  BP 131/73   Pulse 63   Temp (!) 97.4 F (36.3 C)   Ht 5\' 10"  (1.778 m)   Wt 197 lb (89.4 kg)   SpO2  98%   BMI 28.27 kg/m   Vitals:   03/13/19 1129 03/13/19 1144  BP: (!) 148/74 131/73  Pulse: 63   Temp: (!) 97.4 F (36.3 C)   SpO2: 98%      Recent Results (from the past 2160 hour(s))  PSA     Status: None   Collection Time: 01/06/19  9:41 AM  Result Value Ref Range   PSA 2.3 < OR = 4.0 ng/mL    Comment: The total PSA value from this assay system is  standardized against the WHO standard. The test  result will be approximately 20% lower when compared  to the equimolar-standardized total PSA (Beckman  Coulter). Comparison of serial PSA results should be  interpreted with this fact in mind. . This test was performed using the Siemens  chemiluminescent method. Values obtained from  different assay methods cannot be used interchangeably. PSA levels, regardless of value, should not be interpreted as absolute evidence of the presence or absence of disease.   COMPLETE METABOLIC PANEL WITH GFR  Status: Abnormal   Collection Time: 01/06/19  9:41 AM  Result Value Ref Range   Glucose, Bld 103 (H) 65 - 99 mg/dL    Comment: .            Fasting reference interval . For someone without known diabetes, a glucose value between 100 and 125 mg/dL is consistent with prediabetes and should be confirmed with a follow-up test. .    BUN 18 7 - 25 mg/dL   Creat 0.981.10 1.190.70 - 1.471.25 mg/dL    Comment: For patients >64 years of age, the reference limit for Creatinine is approximately 13% higher for people identified as African-American. .    GFR, Est Non African American 71 > OR = 60 mL/min/1.5573m2   GFR, Est African American 82 > OR = 60 mL/min/1.673m2   BUN/Creatinine Ratio NOT APPLICABLE 6 - 22 (calc)   Sodium 140 135 - 146 mmol/L   Potassium 3.6 3.5 - 5.3 mmol/L   Chloride 100 98 - 110 mmol/L   CO2 29 20 - 32 mmol/L   Calcium 10.0 8.6 - 10.3 mg/dL   Total Protein 6.7 6.1 - 8.1 g/dL   Albumin 4.3 3.6 - 5.1 g/dL   Globulin 2.4 1.9 - 3.7 g/dL (calc)   AG Ratio 1.8 1.0 - 2.5 (calc)    Total Bilirubin 0.5 0.2 - 1.2 mg/dL   Alkaline phosphatase (APISO) 104 35 - 144 U/L   AST 22 10 - 35 U/L   ALT 30 9 - 46 U/L  CBC     Status: Abnormal   Collection Time: 01/06/19  9:41 AM  Result Value Ref Range   WBC 5.6 3.8 - 10.8 Thousand/uL   RBC 5.47 4.20 - 5.80 Million/uL   Hemoglobin 17.2 (H) 13.2 - 17.1 g/dL   HCT 82.950.0 56.238.5 - 13.050.0 %   MCV 91.4 80.0 - 100.0 fL   MCH 31.4 27.0 - 33.0 pg   MCHC 34.4 32.0 - 36.0 g/dL   RDW 86.512.7 78.411.0 - 69.615.0 %   Platelets 237 140 - 400 Thousand/uL   MPV 10.6 7.5 - 12.5 fL  Lipid Panel w/reflex Direct LDL     Status: Abnormal   Collection Time: 01/06/19  9:41 AM  Result Value Ref Range   Cholesterol 188 <200 mg/dL   HDL 46 > OR = 40 mg/dL   Triglycerides 295172 (H) <150 mg/dL   LDL Cholesterol (Calc) 113 (H) mg/dL (calc)    Comment: Reference range: <100 . Desirable range <100 mg/dL for primary prevention;   <70 mg/dL for patients with CHD or diabetic patients  with > or = 2 CHD risk factors. Marland Kitchen. LDL-C is now calculated using the Martin-Hopkins  calculation, which is a validated novel method providing  better accuracy than the Friedewald equation in the  estimation of LDL-C.  Horald PollenMartin SS et al. Lenox AhrJAMA. 2841;324(402013;310(19): 2061-2068  (http://education.[facility].com/faq/FAQ164)    Total CHOL/HDL Ratio 4.1 <5.0 (calc)   Non-HDL Cholesterol (Calc) 142 (H) <130 mg/dL (calc)    Comment: For patients with diabetes plus 1 major ASCVD risk  factor, treating to a non-HDL-C goal of <100 mg/dL  (LDL-C of <10<70 mg/dL) is considered a therapeutic  option.       Assessment and Plan: 63 y.o. male with   .Andres Jordan was seen today for rash.  Diagnoses and all orders for this visit:  Need for immunization against influenza -     Flu Vaccine QUAD 36+ mos IM   Intertriginous rash, chronic >1 year Failed  oral fluconazole and nystatin powder Resolved with Erytromycin and Lotrisone May repeat Lotrisone for 1-2 weeks for recurrent symptoms   Patient  education and anticipatory guidance given Patient agrees with treatment plan Follow-up in 2 weeks for rash or sooner as needed if symptoms worsen or fail to improve  Levonne Hubertharley E. Haru Anspaugh PA-C

## 2019-03-13 NOTE — Patient Instructions (Signed)
Intertrigo Intertrigo is skin irritation or inflammation (dermatitis) that occurs when folds of skin rub together. The irritation can cause a rash and make skin raw and itchy. This condition most commonly occurs in the skin folds of these areas:  Toes.  Armpits.  Groin.  Under the belly.  Under the breasts.  Buttocks. Intertrigo is not passed from person to person (is not contagious). What are the causes? This condition is caused by heat, moisture, rubbing (friction), and not enough air circulation. The condition can be made worse by:  Sweat.  Bacteria.  A fungus, such as yeast. What increases the risk? This condition is more likely to occur if you have moisture in your skin folds. You are more likely to develop this condition if you:  Have diabetes.  Are overweight.  Are not able to move around or are not active.  Live in a warm and moist climate.  Wear splints, braces, or other medical devices.  Are not able to control your bowels or bladder (have incontinence). What are the signs or symptoms? Symptoms of this condition include:  A pink or red skin rash in the skin fold or near the skin fold.  Raw or scaly skin.  Itchiness.  A burning feeling.  Bleeding.  Leaking fluid.  A bad smell. How is this diagnosed? This condition is diagnosed with a medical history and physical exam. You may also have a skin swab to test for bacteria or a fungus. How is this treated? This condition may be treated by:  Cleaning and drying your skin.  Taking an antibiotic medicine or using an antibiotic skin cream for a bacterial infection.  Using an antifungal cream on your skin or taking pills for an infection that was caused by a fungus, such as yeast.  Using a steroid ointment to relieve itchiness and irritation.  Separating the skin fold with a clean cotton cloth to absorb moisture and allow air to flow into the area. Follow these instructions at home:  Keep the  affected area clean and dry.  Do not scratch your skin.  Stay in a cool environment as much as possible. Use an air conditioner or fan, if available.  Apply over-the-counter and prescription medicines only as told by your health care provider.  If you were prescribed an antibiotic medicine, use it as told by your health care provider. Do not stop using the antibiotic even if your condition improves.  Keep all follow-up visits as told by your health care provider. This is important. How is this prevented?   Maintain a healthy weight.  Take care of your feet, especially if you have diabetes. Foot care includes: ? Wearing shoes that fit well. ? Keeping your feet dry. ? Wearing clean, breathable socks.  Protect the skin around your groin and buttocks, especially if you have incontinence. Skin protection includes: ? Following a regular cleaning routine. ? Using skin protectant creams, powders, or ointments. ? Changing protection pads frequently.  Do not wear tight clothes. Wear clothes that are loose, absorbent, and made of cotton.  Wear a bra that gives good support, if needed.  Shower and dry yourself well after activity or exercise. Use a hair dryer on a cool setting to dry between skin folds, especially after you bathe.  If you have diabetes, keep your blood sugar under control. Contact a health care provider if:  Your symptoms do not improve with treatment.  Your symptoms get worse or they spread.  You notice increased redness and   warmth.  You have a fever. Summary  Intertrigo is skin irritation or inflammation (dermatitis) that occurs when folds of skin rub together.  This condition is caused by heat, moisture, rubbing (friction), and not enough air circulation.  This condition may be treated by cleaning and drying your skin and with medicines.  Apply over-the-counter and prescription medicines only as told by your health care provider.  Keep all follow-up visits  as told by your health care provider. This is important. This information is not intended to replace advice given to you by your health care provider. Make sure you discuss any questions you have with your health care provider. Document Released: 07/09/2005 Document Revised: 12/09/2017 Document Reviewed: 12/09/2017 Elsevier Patient Education  2020 Elsevier Inc.  

## 2019-04-07 DIAGNOSIS — G4733 Obstructive sleep apnea (adult) (pediatric): Secondary | ICD-10-CM | POA: Diagnosis not present

## 2019-04-30 ENCOUNTER — Other Ambulatory Visit: Payer: Self-pay | Admitting: Physician Assistant

## 2019-04-30 DIAGNOSIS — I10 Essential (primary) hypertension: Secondary | ICD-10-CM

## 2019-05-02 ENCOUNTER — Other Ambulatory Visit: Payer: Self-pay | Admitting: Physician Assistant

## 2019-05-02 DIAGNOSIS — I10 Essential (primary) hypertension: Secondary | ICD-10-CM

## 2019-05-04 NOTE — Telephone Encounter (Signed)
First 30 day.    Anderson Malta,   Please call patient to schedule for new patient appointment or at least a follow up with a provider.

## 2019-05-05 NOTE — Telephone Encounter (Signed)
Dom Editor, commissioning) called pt and left a voicemail for pt to call back and schedule a F/u appt on his meds

## 2019-05-08 ENCOUNTER — Other Ambulatory Visit: Payer: Self-pay | Admitting: Physician Assistant

## 2019-05-08 DIAGNOSIS — I10 Essential (primary) hypertension: Secondary | ICD-10-CM

## 2019-06-23 ENCOUNTER — Telehealth: Payer: Self-pay | Admitting: Physician Assistant

## 2019-06-23 DIAGNOSIS — I10 Essential (primary) hypertension: Secondary | ICD-10-CM

## 2019-06-23 NOTE — Telephone Encounter (Signed)
Patient calling in that he is out of his blood pressure medication. This is for triamterene-hydrochlorothiazide (MAXZIDE-25) 37.5-25 MG tablet [[id number]] . Please advise.

## 2019-06-24 MED ORDER — TRIAMTERENE-HCTZ 37.5-25 MG PO TABS: 1.0000 | ORAL_TABLET | Freq: Every day | ORAL | 0 refills | Status: DC

## 2019-06-24 NOTE — Telephone Encounter (Signed)
Left patient a voicemail with information below. Let patient know to call us back to schedule an appointment. 

## 2019-06-24 NOTE — Telephone Encounter (Signed)
He needs to schedule an appointment to establish care with a provider. Then we can refill the medication.

## 2019-06-24 NOTE — Telephone Encounter (Signed)
Prescription sent

## 2019-07-16 DIAGNOSIS — G4733 Obstructive sleep apnea (adult) (pediatric): Secondary | ICD-10-CM | POA: Diagnosis not present

## 2019-07-30 ENCOUNTER — Encounter: Payer: Self-pay | Admitting: Physician Assistant

## 2019-07-30 ENCOUNTER — Ambulatory Visit (INDEPENDENT_AMBULATORY_CARE_PROVIDER_SITE_OTHER): Payer: BC Managed Care – PPO | Admitting: Physician Assistant

## 2019-07-30 VITALS — BP 137/79 | HR 65 | Ht 70.0 in | Wt 199.0 lb

## 2019-07-30 DIAGNOSIS — N529 Male erectile dysfunction, unspecified: Secondary | ICD-10-CM | POA: Diagnosis not present

## 2019-07-30 DIAGNOSIS — L819 Disorder of pigmentation, unspecified: Secondary | ICD-10-CM | POA: Diagnosis not present

## 2019-07-30 DIAGNOSIS — I10 Essential (primary) hypertension: Secondary | ICD-10-CM

## 2019-07-30 DIAGNOSIS — E782 Mixed hyperlipidemia: Secondary | ICD-10-CM | POA: Diagnosis not present

## 2019-07-30 DIAGNOSIS — L918 Other hypertrophic disorders of the skin: Secondary | ICD-10-CM

## 2019-07-30 MED ORDER — METOPROLOL TARTRATE 50 MG PO TABS: 50.0000 mg | ORAL_TABLET | Freq: Two times a day (BID) | ORAL | 0 refills | Status: DC

## 2019-07-30 MED ORDER — TRIAMTERENE-HCTZ 37.5-25 MG PO TABS: 1.0000 | ORAL_TABLET | Freq: Every day | ORAL | 0 refills | Status: DC

## 2019-07-30 MED ORDER — SILDENAFIL CITRATE 20 MG PO TABS: ORAL_TABLET | ORAL | 1 refills | Status: DC

## 2019-07-30 MED ORDER — ATORVASTATIN CALCIUM 20 MG PO TABS: 20.0000 mg | ORAL_TABLET | Freq: Every day | ORAL | 0 refills | Status: DC

## 2019-07-30 NOTE — Progress Notes (Signed)
HPI:                                                                Andres Jordan is a 64 y.o. male who presents to [place]: [place] today for hypertension follow-up  HTN: Compliant with medications. Does not check BP's at home. Denies vision change, headache, chest pain with exertion, orthopnea, lightheadedness, syncope and edema. Risk factors include: male sex, age>55  OSA: compliant with CPAP  ED: unchanged. No new sexual partners. Requesting refill of Sildenafil  Skin concern: reports he has several lesions in his left axilla and lower abdomen. Lesions have been present for years. He has not noted any changes. Denies itching, bleeding, tenderness. Denies personal or family hx of skin cancer.  Depression screen [facility] 2/9 07/30/2019 03/13/2019 09/30/2017  Decreased Interest 0 0 0  Down, Depressed, Hopeless 0 0 0  PHQ - 2 Score 0 0 0  Altered sleeping 0 - -  Tired, decreased energy 0 - -  Change in appetite 0 - -  Feeling bad or failure about yourself  0 - -  Trouble concentrating 0 - -  Moving slowly or fidgety/restless 0 - -  Suicidal thoughts 0 - -  PHQ-9 Score 0 - -  Difficult doing work/chores Not difficult at all - -    GAD 7 : Generalized Anxiety Score 07/30/2019  Nervous, Anxious, on Edge 0  Control/stop worrying 0  Worry too much - different things 0  Trouble relaxing 0  Restless 0  Easily annoyed or irritable 0  Afraid - awful might happen 0  Total GAD 7 Score 0  Anxiety Difficulty Not difficult at all      Past Medical History:  Diagnosis Date  . Erectile dysfunction   . Fasting hyperglycemia 01/09/2019  . Hyperlipidemia   . Hypertension   . Hypogonadism in male   . OSA on CPAP 10/01/2017   Past Surgical History:  Procedure Laterality Date  . NO PAST SURGERIES     Social History   Tobacco Use  . Smoking status: Never Smoker  . Smokeless tobacco: Never Used  Substance Use Topics  . Alcohol use: No   family  history includes Aneurysm in his mother; Diabetes in his sister; Heart attack in his father; Hypertension in his sister.    ROS: negative except as noted in the HPI  Medications: Current Outpatient Medications  Medication Sig Dispense Refill  . atorvastatin (LIPITOR) 20 MG tablet Take 1 tablet (20 mg total) by mouth at bedtime. 90 tablet 0  . clotrimazole-betamethasone (LOTRISONE) cream Apply 1 application topically 2 (two) times daily. Apply a thin film to the affected skin area, including 1/2 inch of surrounding healthy skin,  twice daily for 1-2 weeks. Max 45g/week 45 g 2  . metoprolol tartrate (LOPRESSOR) 50 MG tablet Take 1 tablet (50 mg total) by mouth 2 (two) times daily. 180 tablet 0  . triamterene-hydrochlorothiazide (MAXZIDE-25) 37.5-25 MG tablet Take 1 tablet by mouth daily. 90 tablet 0  . sildenafil (REVATIO) 20 MG tablet Take 1 - 5 tablets PO as needed 30 minutes prior to sexual activity 50 tablet 1   No current facility-administered medications for this visit.   No Known Allergies     Objective:  BP 137/79   Pulse 65   Ht 5\' 10"  (1.778 m)   Wt 199 lb (90.3 kg)   SpO2 100%   BMI 28.55 kg/m   Wt Readings from Last 3 Encounters:  07/30/19 199 lb (90.3 kg)  03/13/19 197 lb (89.4 kg)  01/06/19 196 lb (88.9 kg)   Temp Readings from Last 3 Encounters:  03/13/19 (!) 97.4 F (36.3 C)  02/17/19 98.6 F (37 C) (Oral)  01/06/19 98.3 F (36.8 C) (Oral)   BP Readings from Last 3 Encounters:  07/30/19 137/79  03/13/19 131/73  02/17/19 (!) 154/88   Pulse Readings from Last 3 Encounters:  07/30/19 65  03/13/19 63  02/17/19 73    Gen:  alert, not ill-appearing, no distress, appropriate for age HEENT: head normocephalic without obvious abnormality, conjunctiva and cornea clear, trachea midline, no carotid bruit Pulm: Normal work of breathing, normal phonation, clear to auscultation bilaterally, no wheezes, rales or rhonchi CV: Normal rate, regular rhythm, s1 and s2  distinct, no murmurs, clicks or rubs  Neuro: alert and oriented x 3, no tremor MSK: extremities atraumatic, normal gait and station Skin: intact, multiple pigmented skin tags of the left axilla; two <1 cm lesions of the lower abdomen/suprapubic region - one scaly, whitish plaque and one variegated pigmented papule  The 10-year ASCVD risk score 02/19/19 DC Jr., et al., 2013) is: 15.4%   Values used to calculate the score:     Age: 63 years     Sex: Male     Is Non-Hispanic African American: No     Diabetic: No     Tobacco smoker: No     Systolic Blood Pressure: 137 mmHg     Is BP treated: Yes     HDL Cholesterol: 46 mg/dL     Total Cholesterol: 188 mg/dL   Lab Results  Component Value Date   CREATININE 1.10 01/06/2019   BUN 18 01/06/2019   NA 140 01/06/2019   K 3.6 01/06/2019   CL 100 01/06/2019   CO2 29 01/06/2019      Assessment and Plan: 64 y.o. male with   .Johm was seen today for hypertension.  Diagnoses and all orders for this visit:  Atypical pigmented skin lesion  Hypertension goal BP (blood pressure) < 130/80 -     triamterene-hydrochlorothiazide (MAXZIDE-25) 37.5-25 MG tablet; Take 1 tablet by mouth daily. -     metoprolol tartrate (LOPRESSOR) 50 MG tablet; Take 1 tablet (50 mg total) by mouth 2 (two) times daily.  Erectile dysfunction, unspecified erectile dysfunction type -     sildenafil (REVATIO) 20 MG tablet; Take 1 - 5 tablets PO as needed 30 minutes prior to sexual activity  Mixed hyperlipidemia -     atorvastatin (LIPITOR) 20 MG tablet; Take 1 tablet (20 mg total) by mouth at bedtime.  Multiple acquired skin tags  BP in range Refills provided as above Fasting labs due June 2021 Counseled on therapeutic lifestyle changes  Recommend patient return for possible shave or punch biopsy of the two questionable lesions of his lower abdomen/suprapubic area Scheduled with Dr. July 2021  Patient education and anticipatory guidance given Patient agrees  with treatment plan Follow-up in 5 months for fasting labs/med mgmt or sooner as needed if symptoms worsen or fail to improve  Benjamin Stain PA-C

## 2019-07-30 NOTE — Patient Instructions (Addendum)
DUE FOR FASTING BLOOD WORK June 2021 Call ahead to request the lab order   For your blood pressure: - Goal <130/80 (Ideally 120's/70's) - take your blood pressure medication in the morning (unless instructed differently) - take baby aspirin 81 mg daily to help prevent heart attack/stroke - monitor and log blood pressures at home - check around the same time each day in a relaxed setting - Limit salt to <2500 mg/day - Follow DASH (Dietary Approach to Stopping Hypertension) eating plan - Try to get at least 150 minutes of aerobic exercise per week - Aim to go on a brisk walk 30 minutes per day at least 5 days per week. If you're not active, gradually increase how long you walk by 5 minutes each week - limit alcohol: 2 standard drinks per day for men and 1 per day for women - avoid tobacco/nicotine products. Consider smoking cessation if you smoke - weight loss: 7% of current body weight can reduce your blood pressure by 5-10 points - follow-up at least every 6 months for your blood pressure. Follow-up sooner if your BP is not controlled

## 2019-07-31 NOTE — Telephone Encounter (Signed)
Received a fax from insurance that Sildenafil was approved from 07/31/2019 through 07/30/2020. Pharmacy aware. No other concerns. Forms sent to scan.

## 2019-08-10 ENCOUNTER — Ambulatory Visit: Payer: BC Managed Care – PPO | Admitting: Sports Medicine

## 2019-08-12 DIAGNOSIS — L918 Other hypertrophic disorders of the skin: Secondary | ICD-10-CM | POA: Insufficient documentation

## 2019-08-12 DIAGNOSIS — L819 Disorder of pigmentation, unspecified: Secondary | ICD-10-CM | POA: Insufficient documentation

## 2019-08-13 ENCOUNTER — Other Ambulatory Visit: Payer: Self-pay

## 2019-08-13 ENCOUNTER — Other Ambulatory Visit: Payer: Self-pay | Admitting: Sports Medicine

## 2019-08-13 ENCOUNTER — Ambulatory Visit (INDEPENDENT_AMBULATORY_CARE_PROVIDER_SITE_OTHER): Payer: BC Managed Care – PPO | Admitting: Sports Medicine

## 2019-08-13 DIAGNOSIS — L820 Inflamed seborrheic keratosis: Secondary | ICD-10-CM

## 2019-08-13 NOTE — Assessment & Plan Note (Signed)
Pigmented lesion under the umbilicus is just seborrheic keratosis, we will leave this alone. He does have a suspicious lesion underneath, it does appear to possibly be a basal cell or squamous cell carcinoma, shave biopsy today. Return to see me in 2 weeks for a wound check.

## 2019-08-13 NOTE — Addendum Note (Signed)
Addended by: Donne Anon L on: 08/13/2019 10:27 AM   Modules accepted: Orders

## 2019-08-13 NOTE — Progress Notes (Signed)
    Procedures performed today:    Procedure: Shave biopsy of 1.5 cm abdominal skin lesion Risks, benefits, and alternatives explained and consent obtained. Time out conducted. Surface prepped with alcohol. 2cc lidocaine with epinephine infiltrated in a field block. Adequate anesthesia ensured. Area prepped and draped in a sterile fashion. Excision performed with: Using a derma blade I shaved into the deep dermis, we then used a Hyfrecator to achieve hemostasis.   Pt stable.  Independent interpretation of tests performed by another provider:   None.  Impression and Recommendations:    Seborrheic keratoses, inflamed Pigmented lesion under the umbilicus is just seborrheic keratosis, we will leave this alone. He does have a suspicious lesion underneath, it does appear to possibly be a basal cell or squamous cell carcinoma, shave biopsy today. Return to see me in 2 weeks for a wound check.    ___________________________________________ Ihor [place]. Benjamin Stain, M.D., ABFM., CAQSM. Primary Care and Sports Medicine [facility]  Adjunct Instructor of Family Medicine  [employer]

## 2019-10-29 DIAGNOSIS — G4733 Obstructive sleep apnea (adult) (pediatric): Secondary | ICD-10-CM | POA: Diagnosis not present

## 2019-11-05 ENCOUNTER — Other Ambulatory Visit: Payer: Self-pay

## 2019-11-05 ENCOUNTER — Ambulatory Visit (INDEPENDENT_AMBULATORY_CARE_PROVIDER_SITE_OTHER): Payer: BC Managed Care – PPO | Admitting: Family Medicine

## 2019-11-05 ENCOUNTER — Encounter: Payer: Self-pay | Admitting: Family Medicine

## 2019-11-05 VITALS — BP 140/80 | HR 72 | Temp 97.8°F | Ht 70.0 in | Wt 198.0 lb

## 2019-11-05 DIAGNOSIS — I10 Essential (primary) hypertension: Secondary | ICD-10-CM

## 2019-11-05 DIAGNOSIS — E782 Mixed hyperlipidemia: Secondary | ICD-10-CM | POA: Diagnosis not present

## 2019-11-05 DIAGNOSIS — R7301 Impaired fasting glucose: Secondary | ICD-10-CM | POA: Diagnosis not present

## 2019-11-05 DIAGNOSIS — Z9989 Dependence on other enabling machines and devices: Secondary | ICD-10-CM

## 2019-11-05 DIAGNOSIS — N401 Enlarged prostate with lower urinary tract symptoms: Secondary | ICD-10-CM

## 2019-11-05 DIAGNOSIS — G4733 Obstructive sleep apnea (adult) (pediatric): Secondary | ICD-10-CM

## 2019-11-05 MED ORDER — TRIAMTERENE-HCTZ 37.5-25 MG PO TABS: 1.0000 | ORAL_TABLET | Freq: Every day | ORAL | 2 refills | Status: DC

## 2019-11-05 MED ORDER — METOPROLOL TARTRATE 50 MG PO TABS: 50.0000 mg | ORAL_TABLET | Freq: Two times a day (BID) | ORAL | 2 refills | Status: DC

## 2019-11-05 MED ORDER — TAMSULOSIN HCL 0.4 MG PO CAPS: 0.4000 mg | ORAL_CAPSULE | Freq: Every day | ORAL | 2 refills | Status: DC

## 2019-11-05 MED ORDER — ATORVASTATIN CALCIUM 20 MG PO TABS: 20.0000 mg | ORAL_TABLET | Freq: Every day | ORAL | 2 refills | Status: DC

## 2019-11-05 NOTE — Assessment & Plan Note (Signed)
Tolerating atorvastatin well, continue.  Update lipid panel.

## 2019-11-05 NOTE — Progress Notes (Signed)
Andres Jordan - 64 y.o. male MRN [id number]  Date of birth: [birth year]  Subjective Chief Complaint  Patient presents with  . Hypertension    HPI Andres Jordan is a 64 y.o. male with history of HTN, HLD, prediabetes and OSA here today for follow up visit.  He reports that he is doing well overall and has no new concerns today.     -HTN: He has been out of Maxzide for about 1 week.  Seen at pulmonology office last week for OSA f/u and bp was normal.  He denies symptoms related to HTN including chest pain, shortness of breath, palpitations, headache or vision changes.   -HLD:  Current management with atorvastatin.  He is tolerating this well and denies side effects from medication.   -OSA:  Followed by pulmonology, using CPAP.  Doing well with this.   -BPH: Having some feelings of incomplete emptying and nocturia.  Denies pain with urination.  Has been going on for a while but didn't really feel comfortable discussing with male provider at prior appts.   AUASS of 14.    ROS:  A comprehensive ROS was completed and negative except as noted per HPI  No Known Allergies  Past Medical History:  Diagnosis Date  . Erectile dysfunction   . Fasting hyperglycemia 01/09/2019  . Hyperlipidemia   . Hypertension   . Hypogonadism in male   . OSA on CPAP 10/01/2017    Past Surgical History:  Procedure Laterality Date  . NO PAST SURGERIES      Social History   Socioeconomic History  . Marital status: Single    Spouse name: Not on file  . Number of children: Not on file  . Years of education: Not on file  . Highest education level: Not on file  Occupational History  . Not on file  Tobacco Use  . Smoking status: Never Smoker  . Smokeless tobacco: Never Used  Substance and Sexual Activity  . Alcohol use: No  . Drug use: No  . Sexual activity: Not Currently    Partners: Male    Birth control/protection: Condom  Other Topics Concern  . Not on file  Social History Narrative  . Not on  file   Social Determinants of Health   Financial Resource Strain:   . Difficulty of Paying Living Expenses:   Food Insecurity:   . Worried About Programme researcher, broadcasting/film/video in the Last Year:   . Barista in the Last Year:   Transportation Needs:   . Freight forwarder (Medical):   Marland Kitchen Lack of Transportation (Non-Medical):   Physical Activity:   . Days of Exercise per Week:   . Minutes of Exercise per Session:   Stress:   . Feeling of Stress :   Social Connections:   . Frequency of Communication with Friends and Family:   . Frequency of Social Gatherings with Friends and Family:   . Attends Religious Services:   . Active Member of Clubs or Organizations:   . Attends Banker Meetings:   Marland Kitchen Marital Status:     Family History  Problem Relation Age of Onset  . Diabetes Sister   . Hypertension Sister   . Aneurysm Mother   . Heart attack Father     Health Maintenance  Topic Date Due  . TETANUS/TDAP  Never done  . INFLUENZA VACCINE  02/21/2020  . COLONOSCOPY  02/24/2025  . Hepatitis C Screening  Completed  . HIV Screening  Completed     ----------------------------------------------------------------------------------------------------------------------------------------------------------------------------------------------------------------- Physical Exam BP 140/80   Pulse 72   Temp 97.8 F (36.6 C) (Oral)   Ht 5\' 10"  (1.778 m)   Wt 198 lb (89.8 kg)   BMI 28.41 kg/m   Physical Exam Constitutional:      Appearance: Normal appearance.  HENT:     Head: Normocephalic and atraumatic.  Eyes:     General: No scleral icterus. Cardiovascular:     Rate and Rhythm: Normal rate and regular rhythm.  Pulmonary:     Effort: Pulmonary effort is normal.     Breath sounds: Normal breath sounds.  Musculoskeletal:     Cervical back: Neck supple.  Skin:    General: Skin is warm and dry.  Neurological:     General: No focal deficit present.     Mental Status:  He is alert.  Psychiatric:        Mood and Affect: Mood normal.        Behavior: Behavior normal.     ------------------------------------------------------------------------------------------------------------------------------------------------------------------------------------------------------------------- Assessment and Plan  Hypertension goal BP (blood pressure) < 130/80 Blood pressure is at goal at for age and co-morbidities.  I recommend that he restart maxzide. Continue metoprolol.  In addition they were instructed to follow a low sodium diet with regular exercise to help to maintain adequate control of blood pressure.    OSA on CPAP Followed by pulmonology, stable at this time.   Benign non-nodular prostatic hyperplasia with lower urinary tract symptoms Will provide trial of flomax to see if symptoms improve with this.  Update PSA  Fasting hyperglycemia Update a1c today.   Mixed hyperlipidemia Tolerating atorvastatin well, continue.  Update lipid panel.    Meds ordered this encounter  Medications  . triamterene-hydrochlorothiazide (MAXZIDE-25) 37.5-25 MG tablet    Sig: Take 1 tablet by mouth daily.    Dispense:  90 tablet    Refill:  2  . metoprolol tartrate (LOPRESSOR) 50 MG tablet    Sig: Take 1 tablet (50 mg total) by mouth 2 (two) times daily.    Dispense:  180 tablet    Refill:  2  . atorvastatin (LIPITOR) 20 MG tablet    Sig: Take 1 tablet (20 mg total) by mouth at bedtime.    Dispense:  90 tablet    Refill:  2  . tamsulosin (FLOMAX) 0.4 MG CAPS capsule    Sig: Take 1 capsule (0.4 mg total) by mouth daily.    Dispense:  90 capsule    Refill:  2    Return in about 6 months (around 05/06/2020) for HTN.    This visit occurred during the SARS-CoV-2 public health emergency.  Safety protocols were in place, including screening questions prior to the visit, additional usage of staff PPE, and extensive cleaning of exam room while observing appropriate  contact time as indicated for disinfecting solutions.

## 2019-11-05 NOTE — Assessment & Plan Note (Signed)
Will provide trial of flomax to see if symptoms improve with this.  Update PSA

## 2019-11-05 NOTE — Assessment & Plan Note (Signed)
Update a1c today . 

## 2019-11-05 NOTE — Assessment & Plan Note (Signed)
Followed by pulmonology, stable at this time.

## 2019-11-05 NOTE — Assessment & Plan Note (Signed)
Blood pressure is at goal at for age and co-morbidities.  I recommend that he restart maxzide. Continue metoprolol.  In addition they were instructed to follow a low sodium diet with regular exercise to help to maintain adequate control of blood pressure.

## 2019-11-05 NOTE — Patient Instructions (Signed)
Restart triamterene/hctz.  Continue other medications.  We'll be in touch with lab results.  See me again in about 6 months.

## 2019-11-06 LAB — HEMOGLOBIN A1C
Hgb A1c MFr Bld: 6.5 % of total Hgb — ABNORMAL HIGH (ref <5.7)
Mean Plasma Glucose: 140 (calc)
eAG (mmol/L): 7.7 (calc)

## 2019-11-06 LAB — CBC
HCT: 49.1 % (ref 38.5–50.0)
Hemoglobin: 16.6 g/dL (ref 13.2–17.1)
MCH: 30.9 pg (ref 27.0–33.0)
MCHC: 33.8 g/dL (ref 32.0–36.0)
MCV: 91.4 fL (ref 80.0–100.0)
MPV: 10 fL (ref 7.5–12.5)
Platelets: 228 10*3/uL (ref 140–400)
RBC: 5.37 10*6/uL (ref 4.20–5.80)
RDW: 12.7 % (ref 11.0–15.0)
WBC: 5.3 10*3/uL (ref 3.8–10.8)

## 2019-11-06 LAB — COMPLETE METABOLIC PANEL WITH GFR
AG Ratio: 1.7 (calc) (ref 1.0–2.5)
ALT: 25 U/L (ref 9–46)
AST: 20 U/L (ref 10–35)
Albumin: 3.9 g/dL (ref 3.6–5.1)
Alkaline phosphatase (APISO): 96 U/L (ref 35–144)
BUN: 15 mg/dL (ref 7–25)
CO2: 31 mmol/L (ref 20–32)
Calcium: 9.5 mg/dL (ref 8.6–10.3)
Chloride: 103 mmol/L (ref 98–110)
Creat: 1.05 mg/dL (ref 0.70–1.25)
GFR, Est African American: 87 mL/min/{1.73_m2} (ref >=60)
GFR, Est Non African American: 75 mL/min/{1.73_m2} (ref >=60)
Globulin: 2.3 g/dL (calc) (ref 1.9–3.7)
Glucose, Bld: 115 mg/dL — ABNORMAL HIGH (ref 65–99)
Potassium: 3.9 mmol/L (ref 3.5–5.3)
Sodium: 140 mmol/L (ref 135–146)
Total Bilirubin: 0.6 mg/dL (ref 0.2–1.2)
Total Protein: 6.2 g/dL (ref 6.1–8.1)

## 2019-11-06 LAB — PSA: PSA: 2 ng/mL (ref <=4.0)

## 2019-11-06 LAB — LIPID PANEL
Cholesterol: 177 mg/dL (ref <200)
HDL: 41 mg/dL (ref >=40)
LDL Cholesterol (Calc): 108 mg/dL (calc) — ABNORMAL HIGH
Non-HDL Cholesterol (Calc): 136 mg/dL (calc) — ABNORMAL HIGH (ref <130)
Total CHOL/HDL Ratio: 4.3 (calc) (ref <5.0)
Triglycerides: 168 mg/dL — ABNORMAL HIGH (ref <150)

## 2020-02-26 ENCOUNTER — Emergency Department
Admission: EM | Admit: 2020-02-26 | Discharge: 2020-02-26 | Disposition: A | Discharge status: Home / Self Care | Payer: BC Managed Care – PPO | Source: Home / Self Care | Attending: Family Medicine | Admitting: Family Medicine

## 2020-02-26 ENCOUNTER — Other Ambulatory Visit: Payer: Self-pay

## 2020-02-26 DIAGNOSIS — M10071 Idiopathic gout, right ankle and foot: Secondary | ICD-10-CM

## 2020-02-26 MED ORDER — PREDNISONE 20 MG PO TABS: ORAL_TABLET | ORAL | 0 refills | Status: DC

## 2020-02-26 NOTE — ED Triage Notes (Signed)
Patient presents to Urgent Care with complaints of right big toe pain that extends to his foot since about 5 days ago. Patient reports he has not been able to work, has been trying to eat well and drink a lot of water to decrease the pain (thinks it might be gout). Pt has had injections in it in the past, last one was about a year ago.

## 2020-02-26 NOTE — Discharge Instructions (Addendum)
Increase fluid intake.  May take Tylenol 500mg , two tabs twice daily as needed for pain.

## 2020-02-26 NOTE — ED Provider Notes (Signed)
[facility]    CSN: [id number] Arrival date & time: 02/26/20  1604      History   Chief Complaint Chief Complaint  Patient presents with   Foot Pain    HPI Andres Jordan is a 64 y.o. male.   Patient has a history of occasional gout flare-ups.  Four days ago he developed pain/swelling in his right foot that decreased somewhat after drinking increased water.  The pain has now localized in his right great toe.  He recalls no recent injury or change in activities.  The history is provided by the patient.  Foot Pain This is a recurrent problem. Episode onset: 4 days ago. The problem occurs constantly. The problem has been gradually improving. The symptoms are aggravated by walking. Nothing relieves the symptoms. Treatments tried: increased fluid intake. The treatment provided mild relief.    Past Medical History:  Diagnosis Date   Erectile dysfunction    Fasting hyperglycemia 01/09/2019   Hyperlipidemia    Hypertension    Hypogonadism in male    OSA on CPAP 10/01/2017    Patient Active Problem List   Diagnosis Date Noted   Atypical pigmented skin lesion 08/12/2019   Multiple acquired skin tags 08/12/2019   Fasting hyperglycemia 01/09/2019   Seborrheic keratoses, inflamed 01/06/2019   Intertrigo 01/06/2019   Blepharitis of left upper eyelid 01/06/2019   Mixed hyperlipidemia 10/01/2017   OSA on CPAP 10/01/2017   Erectile dysfunction 09/30/2017   Encounter for monitoring statin therapy 09/30/2017   Hypertension goal BP (blood pressure) < 130/80 09/30/2017   Male hypogonadism 09/30/2017   Benign non-nodular prostatic hyperplasia with lower urinary tract symptoms 02/04/2015   Hypersomnia 09/08/2014   Abnormal electrocardiogram 03/04/2007    Past Surgical History:  Procedure Laterality Date   NO PAST SURGERIES         Home Medications    Prior to Admission medications   Medication Sig Start Date End Date Taking? Authorizing  Provider  atorvastatin (LIPITOR) 20 MG tablet Take 1 tablet (20 mg total) by mouth at bedtime. 11/05/19   Everrett Coombe, DO  clotrimazole-betamethasone (LOTRISONE) cream Apply 1 application topically 2 (two) times daily. Apply a thin film to the affected skin area, including 1/2 inch of surrounding healthy skin,  twice daily for 1-2 weeks. Max 45g/week 03/13/19   Carlis Stable, PA-C  metoprolol tartrate (LOPRESSOR) 50 MG tablet Take 1 tablet (50 mg total) by mouth 2 (two) times daily. 11/05/19   Everrett Coombe, DO  predniSONE (DELTASONE) 20 MG tablet Take one tab by mouth twice daily for 4 days, then one daily. Take with food. 02/26/20   Lattie Haw, MD  sildenafil (REVATIO) 20 MG tablet Take 1 - 5 tablets PO as needed 30 minutes prior to sexual activity 07/30/19   Carlis Stable, PA-C  tamsulosin (FLOMAX) 0.4 MG CAPS capsule Take 1 capsule (0.4 mg total) by mouth daily. 11/05/19   Everrett Coombe, DO  triamterene-hydrochlorothiazide (MAXZIDE-25) 37.5-25 MG tablet Take 1 tablet by mouth daily. 11/05/19   Everrett Coombe, DO    Family History Family History  Problem Relation Age of Onset   Diabetes Sister    Hypertension Sister    Aneurysm Mother    Heart attack Father     Social History Social History   Tobacco Use   Smoking status: Never Smoker   Smokeless tobacco: Never Used  Building services engineer Use: Never used  Substance Use Topics   Alcohol use: Yes  Alcohol/week: 2.0 standard drinks    Types: 2 Shots of liquor per week    Comment: whiskey and brandy at home sometimes   Drug use: No     Allergies   Patient has no known allergies.   Review of Systems Review of Systems  Constitutional: Negative for appetite change, chills, diaphoresis, fatigue and fever.  Musculoskeletal: Positive for arthralgias and joint swelling.  Skin: Positive for color change. Negative for rash.  All other systems reviewed and are negative.    Physical  Exam Triage Vital Signs ED Triage Vitals  Enc Vitals Group     BP 02/26/20 1619 (!) 157/80     Pulse Rate 02/26/20 1619 84     Resp 02/26/20 1619 16     Temp 02/26/20 1619 (!) 97.4 F (36.3 C)     Temp Source 02/26/20 1619 Oral     SpO2 02/26/20 1619 99 %     Weight --      Height --      Head Circumference --      Peak Flow --      Pain Score 02/26/20 1618 6     Pain Loc --      Pain Edu? --      Excl. in GC? --    No data found.  Updated Vital Signs BP (!) 157/80 (BP Location: Right Arm)    Pulse 84    Temp (!) 97.4 F (36.3 C) (Oral)    Resp 16    SpO2 99%   Visual Acuity Right Eye Distance:   Left Eye Distance:   Bilateral Distance:    Right Eye Near:   Left Eye Near:    Bilateral Near:     Physical Exam Vitals and nursing note reviewed.  Constitutional:      General: He is not in acute distress. HENT:     Head: Normocephalic.     Mouth/Throat:     Mouth: Mucous membranes are moist.  Cardiovascular:     Rate and Rhythm: Normal rate.  Pulmonary:     Effort: Pulmonary effort is normal.  Musculoskeletal:     Right lower leg: No edema.     Left lower leg: No edema.       Feet:     Comments: There is tenderness, decreased range of motion, swelling, mild erythema, and warmth over the right first MTP joint.  Skin:    General: Skin is warm and dry.  Neurological:     Mental Status: He is alert and oriented to person, place, and time.      UC Treatments / Results  Labs (all labs ordered are listed, but only abnormal results are displayed) Labs Reviewed - No data to display  EKG   Radiology No results found.  Procedures Procedures (including critical care time)  Medications Ordered in UC Medications - No data to display  Initial Impression / Assessment and Plan / UC Course  I have reviewed the triage vital signs and the nursing notes.  Pertinent labs & imaging results that were available during my care of the patient were reviewed by me and  considered in my medical decision making (see chart for details).    Begin prednisone burst/taper. Followup with Family Doctor if not improved in one week.    Final Clinical Impressions(s) / UC Diagnoses   Final diagnoses:  Acute idiopathic gout involving toe of right foot     Discharge Instructions     Increase fluid intake.  May take Tylenol 500mg , two tabs twice daily as needed for pain.    ED Prescriptions    Medication Sig Dispense Auth. Provider   predniSONE (DELTASONE) 20 MG tablet Take one tab by mouth twice daily for 4 days, then one daily. Take with food. 12 tablet , MD        Lattie Haw, MD 02/28/20 1225

## 2020-04-14 DIAGNOSIS — G4733 Obstructive sleep apnea (adult) (pediatric): Secondary | ICD-10-CM | POA: Diagnosis not present

## 2020-07-27 DIAGNOSIS — G4733 Obstructive sleep apnea (adult) (pediatric): Secondary | ICD-10-CM | POA: Diagnosis not present

## 2020-08-12 ENCOUNTER — Telehealth: Payer: Self-pay

## 2020-08-12 DIAGNOSIS — I10 Essential (primary) hypertension: Secondary | ICD-10-CM

## 2020-08-12 MED ORDER — TRIAMTERENE-HCTZ 37.5-25 MG PO TABS: 1.0000 | ORAL_TABLET | Freq: Every day | ORAL | 0 refills | Status: DC

## 2020-08-12 NOTE — Telephone Encounter (Signed)
1 month supply given.

## 2020-08-12 NOTE — Telephone Encounter (Signed)
  Pt stopped in today to ask if we could send in a rx for triamterene-hydrochlorothiazide (Blood Pressure medicine).  He has taken the last one today. He also made an appt. For 01/24 to be seen for his 6 month f/u.

## 2020-08-15 ENCOUNTER — Ambulatory Visit (INDEPENDENT_AMBULATORY_CARE_PROVIDER_SITE_OTHER): Payer: BC Managed Care – PPO | Admitting: Family Medicine

## 2020-08-15 ENCOUNTER — Encounter: Payer: Self-pay | Admitting: Family Medicine

## 2020-08-15 VITALS — BP 146/84 | HR 100 | Temp 97.4°F | Wt 195.0 lb

## 2020-08-15 DIAGNOSIS — Z20822 Contact with and (suspected) exposure to covid-19: Secondary | ICD-10-CM

## 2020-08-15 DIAGNOSIS — E782 Mixed hyperlipidemia: Secondary | ICD-10-CM

## 2020-08-15 DIAGNOSIS — N401 Enlarged prostate with lower urinary tract symptoms: Secondary | ICD-10-CM

## 2020-08-15 DIAGNOSIS — I10 Essential (primary) hypertension: Secondary | ICD-10-CM

## 2020-08-15 DIAGNOSIS — M109 Gout, unspecified: Secondary | ICD-10-CM | POA: Diagnosis not present

## 2020-08-15 DIAGNOSIS — R7303 Prediabetes: Secondary | ICD-10-CM

## 2020-08-15 LAB — URIC ACID: Uric Acid, Serum: 7.6 mg/dL (ref 4.0–8.0)

## 2020-08-15 MED ORDER — ATORVASTATIN CALCIUM 20 MG PO TABS: 20.0000 mg | ORAL_TABLET | Freq: Every day | ORAL | 3 refills | Status: DC

## 2020-08-15 MED ORDER — TRIAMTERENE-HCTZ 37.5-25 MG PO TABS: 1.0000 | ORAL_TABLET | Freq: Every day | ORAL | 3 refills | Status: DC

## 2020-08-15 MED ORDER — METOPROLOL TARTRATE 50 MG PO TABS: 50.0000 mg | ORAL_TABLET | Freq: Two times a day (BID) | ORAL | 3 refills | Status: DC

## 2020-08-15 NOTE — Patient Instructions (Signed)
Great to see you today! Please continue current medications.  Have labs completed See me again in 6 months.  

## 2020-08-15 NOTE — Progress Notes (Signed)
Andres Jordan - 65 y.o. male MRN [id number]  Date of birth: 02-13-1956  Subjective No chief complaint on file.   HPI Andres Jordan is a 65 y.o. male with history of HTN, prediabetes, OSA, HLD, BPH here today for follow up visit.   HTN is currently managed with maxzide and metoprolol.  He is doing well with current medications and denies side effects.  BP readings at home have been well controlled.  He has not had chest pain, shortness of breath, palpitations, headache or vision changes.    BP symptoms controlled with tamsulosin.  This continues to work well.   Prediabetes managed with diet.  Denies symptoms including increased thirst/urination.    He has had some mild congestion and fatigue.  Would like to have COVID test.    ROS:  A comprehensive ROS was completed and negative except as noted per HPI  No Known Allergies  Past Medical History:  Diagnosis Date  . Erectile dysfunction   . Fasting hyperglycemia 01/09/2019  . Hyperlipidemia   . Hypertension   . Hypogonadism in male   . OSA on CPAP 10/01/2017    Past Surgical History:  Procedure Laterality Date  . NO PAST SURGERIES      Social History   Socioeconomic History  . Marital status: Single    Spouse name: Not on file  . Number of children: Not on file  . Years of education: Not on file  . Highest education level: Not on file  Occupational History  . Not on file  Tobacco Use  . Smoking status: Never Smoker  . Smokeless tobacco: Never Used  Vaping Use  . Vaping Use: Never used  Substance and Sexual Activity  . Alcohol use: Yes    Alcohol/week: 2.0 standard drinks    Types: 2 Shots of liquor per week    Comment: whiskey and brandy at home sometimes  . Drug use: No  . Sexual activity: Not Currently    Partners: Male    Birth control/protection: Condom  Other Topics Concern  . Not on file  Social History Narrative  . Not on file   Social Determinants of Health   Financial Resource Strain: Not on file   Food Insecurity: Not on file  Transportation Needs: Not on file  Physical Activity: Not on file  Stress: Not on file  Social Connections: Not on file    Family History  Problem Relation Age of Onset  . Diabetes Sister   . Hypertension Sister   . Aneurysm Mother   . Heart attack Father     Health Maintenance  Topic Date Due  . TETANUS/TDAP  Never done  . PNA vac Low Risk Adult (1 of 2 - PCV13) Never done  . COVID-19 Vaccine (2 - Booster for [employer] series) 12/23/2019  . INFLUENZA VACCINE  02/21/2020  . COLONOSCOPY (Pts 45-94yrs Insurance coverage will need to be confirmed)  02/24/2025  . Hepatitis C Screening  Completed  . HIV Screening  Completed     ----------------------------------------------------------------------------------------------------------------------------------------------------------------------------------------------------------------- Physical Exam BP (!) 146/84 (BP Location: Left Arm, Patient Position: Sitting, Cuff Size: Large)   Pulse 100   Temp (!) 97.4 F (36.3 C) (Oral)   Wt 195 lb (88.5 kg)   SpO2 100%   BMI 27.98 kg/m   Physical Exam Constitutional:      Appearance: Normal appearance.  HENT:     Head: Normocephalic and atraumatic.  Eyes:     General: No scleral icterus. Cardiovascular:  Rate and Rhythm: Normal rate and regular rhythm.  Pulmonary:     Effort: Pulmonary effort is normal.     Breath sounds: Normal breath sounds.  Musculoskeletal:     Cervical back: Neck supple.  Neurological:     General: No focal deficit present.     Mental Status: He is alert.  Psychiatric:        Mood and Affect: Mood normal.        Behavior: Behavior normal.     ------------------------------------------------------------------------------------------------------------------------------------------------------------------------------------------------------------------- Assessment and Plan  Hypertension goal BP (blood pressure) <  130/80 Continue maxzide and metoprolol at current strength.  Work on low sodium diet.    Benign non-nodular prostatic hyperplasia with lower urinary tract symptoms Lab Results  Component Value Date   PSA 2.0 11/05/2019   PSA 2.3 01/06/2019   PSA 2.1 09/30/2017  Tamsulosin continues to work well for current symptoms.    Mixed hyperlipidemia Tolerating atorvastatin well.  Update lipid panel.   Gout involving toe of right foot History of gout with flare a few months ago.  Update uric acid levels  Prediabetes Update a1c.    Meds ordered this encounter  Medications  . metoprolol tartrate (LOPRESSOR) 50 MG tablet    Sig: Take 1 tablet (50 mg total) by mouth 2 (two) times daily.    Dispense:  180 tablet    Refill:  3  . triamterene-hydrochlorothiazide (MAXZIDE-25) 37.5-25 MG tablet    Sig: Take 1 tablet by mouth daily.    Dispense:  90 tablet    Refill:  3  . atorvastatin (LIPITOR) 20 MG tablet    Sig: Take 1 tablet (20 mg total) by mouth at bedtime.    Dispense:  90 tablet    Refill:  3    No follow-ups on file.    This visit occurred during the SARS-CoV-2 public health emergency.  Safety protocols were in place, including screening questions prior to the visit, additional usage of staff PPE, and extensive cleaning of exam room while observing appropriate contact time as indicated for disinfecting solutions.

## 2020-08-16 LAB — BASIC METABOLIC PANEL
BUN: 21 mg/dL (ref 7–25)
CO2: 31 mmol/L (ref 20–32)
Calcium: 9.6 mg/dL (ref 8.6–10.3)
Chloride: 103 mmol/L (ref 98–110)
Creat: 1.01 mg/dL (ref 0.70–1.25)
Glucose, Bld: 95 mg/dL (ref 65–99)
Potassium: 4.2 mmol/L (ref 3.5–5.3)
Sodium: 140 mmol/L (ref 135–146)

## 2020-08-16 LAB — LIPID PANEL W/REFLEX DIRECT LDL
Cholesterol: 181 mg/dL (ref <200)
HDL: 47 mg/dL (ref >=40)
LDL Cholesterol (Calc): 112 mg/dL (calc) — ABNORMAL HIGH
Non-HDL Cholesterol (Calc): 134 mg/dL (calc) — ABNORMAL HIGH (ref <130)
Total CHOL/HDL Ratio: 3.9 (calc) (ref <5.0)
Triglycerides: 112 mg/dL (ref <150)

## 2020-08-16 LAB — HEMOGLOBIN A1C
Hgb A1c MFr Bld: 6.5 % of total Hgb — ABNORMAL HIGH (ref <5.7)
Mean Plasma Glucose: 140 mg/dL
eAG (mmol/L): 7.7 mmol/L

## 2020-08-17 DIAGNOSIS — R7303 Prediabetes: Secondary | ICD-10-CM | POA: Insufficient documentation

## 2020-08-17 DIAGNOSIS — M109 Gout, unspecified: Secondary | ICD-10-CM | POA: Insufficient documentation

## 2020-08-17 LAB — NOVEL CORONAVIRUS, NAA: SARS-CoV-2, NAA: NOT DETECTED

## 2020-08-17 LAB — SARS-COV-2, NAA 2 DAY TAT

## 2020-08-17 NOTE — Assessment & Plan Note (Signed)
Update a1c 

## 2020-08-17 NOTE — Assessment & Plan Note (Signed)
Tolerating atorvastatin well.  Update lipid panel.  

## 2020-08-17 NOTE — Assessment & Plan Note (Signed)
History of gout with flare a few months ago.  Update uric acid levels

## 2020-08-17 NOTE — Assessment & Plan Note (Signed)
Continue maxzide and metoprolol at current strength.  Work on low sodium diet.

## 2020-08-17 NOTE — Assessment & Plan Note (Signed)
Lab Results  Component Value Date   PSA 2.0 11/05/2019   PSA 2.3 01/06/2019   PSA 2.1 09/30/2017  Tamsulosin continues to work well for current symptoms.

## 2020-10-26 DIAGNOSIS — G4733 Obstructive sleep apnea (adult) (pediatric): Secondary | ICD-10-CM | POA: Diagnosis not present

## 2020-11-03 DIAGNOSIS — R0683 Snoring: Secondary | ICD-10-CM | POA: Diagnosis not present

## 2020-11-03 DIAGNOSIS — G4733 Obstructive sleep apnea (adult) (pediatric): Secondary | ICD-10-CM | POA: Diagnosis not present

## 2021-01-27 DIAGNOSIS — G4733 Obstructive sleep apnea (adult) (pediatric): Secondary | ICD-10-CM | POA: Diagnosis not present

## 2021-03-09 DIAGNOSIS — G4733 Obstructive sleep apnea (adult) (pediatric): Secondary | ICD-10-CM | POA: Diagnosis not present

## 2021-03-14 ENCOUNTER — Telehealth: Payer: Self-pay

## 2021-03-14 NOTE — Telephone Encounter (Signed)
Rcvd vm from patient concerning COVID + status and requesting Paxlovid be sent to pharmacy.   Per note (Angie) pt to schedule virtual appt tomorrow.

## 2021-03-14 NOTE — Telephone Encounter (Signed)
Pt is upset that he left a message this morning and was just now getting taken care of. I told him to do a online [place] virtual visit per [facility] and he is going to try to set one up. If he isn't able to, he'll give [place] a call back

## 2021-03-14 NOTE — Telephone Encounter (Signed)
Please call and schedule patient for a virtual visit for tomorrow.

## 2021-03-16 ENCOUNTER — Telehealth (INDEPENDENT_AMBULATORY_CARE_PROVIDER_SITE_OTHER): Payer: BC Managed Care – PPO | Admitting: Family Medicine

## 2021-03-16 ENCOUNTER — Encounter: Payer: Self-pay | Admitting: Family Medicine

## 2021-03-16 DIAGNOSIS — U071 COVID-19: Secondary | ICD-10-CM

## 2021-03-16 NOTE — Progress Notes (Signed)
Virtual Video Visit via MyChart Note converted to telephone  I connected with  Lavell Anchors on 03/16/21 at 10:30 AM EDT by the video enabled telemedicine application for MyChart, and verified that I am speaking with the correct person using two identifiers.   I introduced myself as a Publishing rights manager with the practice. We discussed the limitations of evaluation and management by telemedicine and the availability of in person appointments. The patient expressed understanding and agreed to proceed.  Participating parties in this visit include: The patient and the nurse practitioner listed.  The patient is: At home I am: In the office - [facility]  Subjective:    CC:  Chief Complaint  Patient presents with   Covid Positive    HPI: Andres Jordan is a 65 y.o. year old male presenting today via MyChart today for COVID+.  Patient reports he started feeling sick Saturday night. His first home COVID test was negative, but symptoms worsened so he rested on Monday which was positive. Reports he felt pretty rough Sunday through Tuesday but yesterday and today he has felt much better. He was initially having fever, chills, headaches, fatigue, coughing. Reports he is no longer having fever/chills, headaches and coughing and fatigue have significantly improved. Reports he still isn't 100%, but does feel much better compared to earlier in the week. He denies any chest pain, shortness of breath, wheezing, nausea, vomiting, lethargy.   He has received 2 vaccines and been taking daily vitamins + zinc since pandemic began.    Past medical history, Surgical history, Family history not pertinant except as noted below, Social history, Allergies, and medications have been entered into the medical record, reviewed, and corrections made.   Review of Systems:  All review of systems negative except what is listed in the HPI   Objective:    General:  Speaking clearly in complete  sentences. Absent shortness of breath noted.   Alert and oriented x3.   Normal judgment.  Absent acute distress.   Impression and Recommendations:    1. COVID-19 Patient is on day 6 of symptoms and already feeling significantly better with no alarm findings. Reports he initially wanted Paxlovid, but now that he is feeling better he doesn't know what he should take. Discussed medication and risks vs benefits. Given that he is already out of 5 day window and feeling mostly better, recommended we continue with symptom management as needed. Continue supportive measures at home - rest, hydration, etc. Patient is agreeable to plan. He has not felt bad enough to take OTC medications but will attach handout to AVS for his reference if needed. Patient aware of signs/symptoms requiring further/urgent evaluation.   Follow-up if symptoms worsen or fail to improve.    I discussed the assessment and treatment plan with the patient. The patient was provided an opportunity to ask questions and all were answered. The patient agreed with the plan and demonstrated an understanding of the instructions.   The patient was advised to call back or seek an in-person evaluation if the symptoms worsen or if the condition fails to improve as anticipated.  I spent 20 minutes dedicated to the care of this patient on the date of this encounter to include pre-visit chart review of prior notes and results, face-to-face time with the patient, and post-visit ordering of testing as indicated.   Clayborne Dana, NP

## 2021-03-16 NOTE — Patient Instructions (Signed)

## 2021-04-09 DIAGNOSIS — G4733 Obstructive sleep apnea (adult) (pediatric): Secondary | ICD-10-CM | POA: Diagnosis not present

## 2021-05-09 DIAGNOSIS — G4733 Obstructive sleep apnea (adult) (pediatric): Secondary | ICD-10-CM | POA: Diagnosis not present

## 2021-06-09 DIAGNOSIS — G4733 Obstructive sleep apnea (adult) (pediatric): Secondary | ICD-10-CM | POA: Diagnosis not present

## 2021-07-09 DIAGNOSIS — G4733 Obstructive sleep apnea (adult) (pediatric): Secondary | ICD-10-CM | POA: Diagnosis not present

## 2021-08-09 DIAGNOSIS — G4733 Obstructive sleep apnea (adult) (pediatric): Secondary | ICD-10-CM | POA: Diagnosis not present

## 2021-08-22 ENCOUNTER — Emergency Department
Admission: EM | Admit: 2021-08-22 | Discharge: 2021-08-22 | Disposition: A | Discharge status: Home / Self Care | Payer: BC Managed Care – PPO | Source: Home / Self Care | Attending: Family Medicine | Admitting: Family Medicine

## 2021-08-22 ENCOUNTER — Encounter: Payer: Self-pay | Admitting: Emergency Medicine

## 2021-08-22 ENCOUNTER — Other Ambulatory Visit: Payer: Self-pay

## 2021-08-22 DIAGNOSIS — R600 Localized edema: Secondary | ICD-10-CM | POA: Diagnosis not present

## 2021-08-22 MED ORDER — CEPHALEXIN 500 MG PO CAPS: 500.0000 mg | ORAL_CAPSULE | Freq: Two times a day (BID) | ORAL | 0 refills | Status: DC

## 2021-08-22 NOTE — Discharge Instructions (Addendum)
Increase your water intake Take Keflex 2 times a day for 5 days This is an antibiotic to reduce risk of infection When you pick up the Keflex at a pharmacy, get a sour candy like lemon heads.  This will help to dislodge a stone See your doctor if not improving by the end of the week

## 2021-08-22 NOTE — ED Triage Notes (Signed)
Rt jaw pain, swollen x 3 days

## 2021-08-22 NOTE — ED Provider Notes (Signed)
[name]    CSN: [id number] Arrival date & time: 08/22/21  0848      History   Chief Complaint Chief Complaint  Patient presents with   Jaw Pain    HPI Victoriano Campion is a 66 y.o. male.   HPI  Patient has swelling of its painless on the right side of his jaw for about 3 days.  Getting slightly worse.  Tender to touch but no pain.  No pain with chewing.  No ear pain.  No recent cold or congestion.  He states some years ago he had a similar episode under his jaw and he was told it was a salivary gland.  It went away with antibiotics  Past Medical History:  Diagnosis Date   Erectile dysfunction    Fasting hyperglycemia 01/09/2019   Hyperlipidemia    Hypertension    Hypogonadism in male    OSA on CPAP 10/01/2017    Patient Active Problem List   Diagnosis Date Noted   Prediabetes 08/17/2020   Gout involving toe of right foot 08/17/2020   Atypical pigmented skin lesion 08/12/2019   Multiple acquired skin tags 08/12/2019   Fasting hyperglycemia 01/09/2019   Seborrheic keratoses, inflamed 01/06/2019   Blepharitis of left upper eyelid 01/06/2019   Mixed hyperlipidemia 10/01/2017   OSA on CPAP 10/01/2017   Erectile dysfunction 09/30/2017   Encounter for monitoring statin therapy 09/30/2017   Hypertension goal BP (blood pressure) < 130/80 09/30/2017   Male hypogonadism 09/30/2017   Benign non-nodular prostatic hyperplasia with lower urinary tract symptoms 02/04/2015   Hypersomnia 09/08/2014   Abnormal electrocardiogram 03/04/2007    Past Surgical History:  Procedure Laterality Date   NO PAST SURGERIES         Home Medications    Prior to Admission medications   Medication Sig Start Date End Date Taking? Authorizing Provider  cephALEXin (KEFLEX) 500 MG capsule Take 1 capsule (500 mg total) by mouth 2 (two) times daily. 08/22/21  Yes Eustace Moore, MD  atorvastatin (LIPITOR) 20 MG tablet Take 1 tablet (20 mg total) by mouth at bedtime. 08/15/20    Everrett Coombe, DO  metoprolol tartrate (LOPRESSOR) 50 MG tablet Take 1 tablet (50 mg total) by mouth 2 (two) times daily. 08/15/20   Everrett Coombe, DO  sildenafil (REVATIO) 20 MG tablet Take 1 - 5 tablets PO as needed 30 minutes prior to sexual activity 07/30/19   Carlis Stable, PA-C  tamsulosin (FLOMAX) 0.4 MG CAPS capsule Take 1 capsule (0.4 mg total) by mouth daily. 11/05/19   Everrett Coombe, DO  triamterene-hydrochlorothiazide (MAXZIDE-25) 37.5-25 MG tablet Take 1 tablet by mouth daily. 08/15/20   Everrett Coombe, DO    Family History Family History  Problem Relation Age of Onset   Diabetes Sister    Hypertension Sister    Aneurysm Mother    Heart attack Father     Social History Social History   Tobacco Use   Smoking status: Never   Smokeless tobacco: Never  Vaping Use   Vaping Use: Never used  Substance Use Topics   Alcohol use: Yes    Alcohol/week: 2.0 standard drinks    Types: 2 Shots of liquor per week    Comment: whiskey and brandy at home sometimes   Drug use: No     Allergies   Patient has no known allergies.   Review of Systems Review of Systems See HPI  Physical Exam Triage Vital Signs ED Triage Vitals  Enc Vitals Group  BP 08/22/21 0910 134/81     Pulse Rate 08/22/21 0910 61     Resp 08/22/21 0910 16     Temp 08/22/21 0910 98.7 F (37.1 C)     Temp Source 08/22/21 0910 Oral     SpO2 08/22/21 0910 98 %     Weight 08/22/21 0911 185 lb (83.9 kg)     Height 08/22/21 0911 5\' 9"  (1.753 m)     Head Circumference --      Peak Flow --      Pain Score 08/22/21 0911 1     Pain Loc --      Pain Edu? --      Excl. in GC? --    No data found.  Updated Vital Signs BP 134/81 (BP Location: Left Arm)    Pulse 61    Temp 98.7 F (37.1 C) (Oral)    Resp 16    Ht 5\' 9"  (1.753 m)    Wt 83.9 kg    SpO2 98%    BMI 27.32 kg/m        Physical Exam Constitutional:      General: He is not in acute distress.    Appearance: He is well-developed.   HENT:     Head: Normocephalic and atraumatic.      Comments: The right parotid is mildly swollen.  Mobile.  No mass palpable.  Mildly tender.  Oropharynx benign    Right Ear: Tympanic membrane and ear canal normal. There is impacted cerumen.     Left Ear: Tympanic membrane and ear canal normal.     Nose: Nose normal. No congestion.     Mouth/Throat:     Mouth: Mucous membranes are moist.     Pharynx: No posterior oropharyngeal erythema.  Eyes:     Conjunctiva/sclera: Conjunctivae normal.     Pupils: Pupils are equal, round, and reactive to light.  Cardiovascular:     Rate and Rhythm: Normal rate.  Pulmonary:     Effort: Pulmonary effort is normal. No respiratory distress.  Abdominal:     General: There is no distension.     Palpations: Abdomen is soft.  Musculoskeletal:        General: Normal range of motion.     Cervical back: Normal range of motion.  Skin:    General: Skin is warm and dry.  Neurological:     Mental Status: He is alert.     UC Treatments / Results  Labs (all labs ordered are listed, but only abnormal results are displayed) Labs Reviewed - No data to display  EKG   Radiology No results found.  Procedures Procedures (including critical care time)  Medications Ordered in UC Medications - No data to display  Initial Impression / Assessment and Plan / UC Course  I have reviewed the triage vital signs and the nursing notes.  Pertinent labs & imaging results that were available during my care of the patient were reviewed by me and considered in my medical decision making (see chart for details).     Swollen parotid may be infection but is likely stone.  Doubt tumor.  We will treat with antibiotics, hydration and sour candy to see if this helps.  Follow-up with PCP Final Clinical Impressions(s) / UC Diagnoses   Final diagnoses:  Swelling of right parotid gland     Discharge Instructions      Increase your water intake Take Keflex 2 times a  day for 5 days This is  an antibiotic to reduce risk of infection When you pick up the Keflex at a pharmacy, get a sour candy like lemon heads.  This will help to dislodge a stone See your doctor if not improving by the end of the week   ED Prescriptions     Medication Sig Dispense Auth. Provider   cephALEXin (KEFLEX) 500 MG capsule Take 1 capsule (500 mg total) by mouth 2 (two) times daily. 10 capsule Eustace Moore, MD      PDMP not reviewed this encounter.   Eustace Moore, MD 08/22/21 [id number]

## 2021-08-23 ENCOUNTER — Ambulatory Visit: Payer: BC Managed Care – PPO | Admitting: Physician Assistant

## 2021-08-26 ENCOUNTER — Other Ambulatory Visit: Payer: Self-pay | Admitting: Family Medicine

## 2021-08-26 DIAGNOSIS — I10 Essential (primary) hypertension: Secondary | ICD-10-CM

## 2021-09-09 DIAGNOSIS — G4733 Obstructive sleep apnea (adult) (pediatric): Secondary | ICD-10-CM | POA: Diagnosis not present

## 2021-09-19 ENCOUNTER — Telehealth: Payer: Self-pay | Admitting: Family Medicine

## 2021-09-19 DIAGNOSIS — I10 Essential (primary) hypertension: Secondary | ICD-10-CM

## 2021-09-19 NOTE — Telephone Encounter (Signed)
Patient is scheduled for September 22, 2021 @ 8:50am w/ Dr. Zigmund Daniel. He would like his BP medication approved until visit - lmr.

## 2021-09-20 MED ORDER — TRIAMTERENE-HCTZ 37.5-25 MG PO TABS: 1.0000 | ORAL_TABLET | Freq: Every day | ORAL | 0 refills | Status: DC

## 2021-09-20 MED ORDER — METOPROLOL TARTRATE 50 MG PO TABS: 50.0000 mg | ORAL_TABLET | Freq: Two times a day (BID) | ORAL | 0 refills | Status: DC

## 2021-09-20 NOTE — Telephone Encounter (Signed)
Medication sent to pharmacy  

## 2021-09-22 ENCOUNTER — Other Ambulatory Visit: Payer: Self-pay

## 2021-09-22 ENCOUNTER — Ambulatory Visit: Payer: BC Managed Care – PPO | Admitting: Family Medicine

## 2021-09-22 ENCOUNTER — Encounter: Payer: Self-pay | Admitting: Family Medicine

## 2021-09-22 VITALS — BP 154/77 | HR 55 | Ht 69.0 in | Wt 188.0 lb

## 2021-09-22 DIAGNOSIS — N401 Enlarged prostate with lower urinary tract symptoms: Secondary | ICD-10-CM

## 2021-09-22 DIAGNOSIS — I10 Essential (primary) hypertension: Secondary | ICD-10-CM

## 2021-09-22 DIAGNOSIS — E782 Mixed hyperlipidemia: Secondary | ICD-10-CM

## 2021-09-22 DIAGNOSIS — R7303 Prediabetes: Secondary | ICD-10-CM

## 2021-09-22 MED ORDER — TRIAMTERENE-HCTZ 37.5-25 MG PO TABS: 1.0000 | ORAL_TABLET | Freq: Every day | ORAL | 3 refills | Status: DC

## 2021-09-22 MED ORDER — METOPROLOL TARTRATE 50 MG PO TABS: 50.0000 mg | ORAL_TABLET | Freq: Two times a day (BID) | ORAL | 3 refills | Status: DC

## 2021-09-22 MED ORDER — ATORVASTATIN CALCIUM 20 MG PO TABS: 20.0000 mg | ORAL_TABLET | Freq: Every day | ORAL | 3 refills | Status: DC

## 2021-09-22 NOTE — Assessment & Plan Note (Signed)
BP elevated today but has not taken medications for today yet.  Readings at home are well controlled.  Will continue current medications at current strength.   ?

## 2021-09-22 NOTE — Assessment & Plan Note (Signed)
Doing well with atorvastatin.  Update lipids and LFT's today.  ?

## 2021-09-22 NOTE — Patient Instructions (Addendum)
Managing Your Hypertension Hypertension, also called high blood pressure, is when the force of the blood pressing against the walls of the arteries is too strong. Arteries are blood vessels that carry blood from your heart throughout your body. Hypertension forces the heart to work harder to pump blood and may cause the arteries to become narrow or stiff. Understanding blood pressure readings Your personal target blood pressure may vary depending on your medical conditions, your age, and other factors. A blood pressure reading includes a higher number over a lower number. Ideally, your blood pressure should be below 120/80. You should know that: The first, or top, number is called the systolic pressure. It is a measure of the pressure in your arteries as your heart beats. The second, or bottom number, is called the diastolic pressure. It is a measure of the pressure in your arteries as the heart relaxes. Blood pressure is classified into four stages. Based on your blood pressure reading, your health care provider may use the following stages to determine what type of treatment you need, if any. Systolic pressure and diastolic pressure are measured in a unit called mmHg. Normal Systolic pressure: below 120. Diastolic pressure: below 80. Elevated Systolic pressure: 120-129. Diastolic pressure: below 80. Hypertension stage 1 Systolic pressure: 130-139. Diastolic pressure: 80-89. Hypertension stage 2 Systolic pressure: 140 or above. Diastolic pressure: 90 or above. How can this condition affect me? Managing your hypertension is an important responsibility. Over time, hypertension can damage the arteries and decrease blood flow to important parts of the body, including the brain, heart, and kidneys. Having untreated or uncontrolled hypertension can lead to: A heart attack. A stroke. A weakened blood vessel (aneurysm). Heart failure. Kidney damage. Eye damage. Metabolic syndrome. Memory and  concentration problems. Vascular dementia. What actions can I take to manage this condition? Hypertension can be managed by making lifestyle changes and possibly by taking medicines. Your health care provider will help you make a plan to bring your blood pressure within a normal range. Nutrition  Eat a diet that is high in fiber and potassium, and low in salt (sodium), added sugar, and fat. An example eating plan is called the Dietary Approaches to Stop Hypertension (DASH) diet. To eat this way: Eat plenty of fresh fruits and vegetables. Try to fill one-half of your plate at each meal with fruits and vegetables. Eat whole grains, such as whole-wheat pasta, brown rice, or whole-grain bread. Fill about one-fourth of your plate with whole grains. Eat low-fat dairy products. Avoid fatty cuts of meat, processed or cured meats, and poultry with skin. Fill about one-fourth of your plate with lean proteins such as fish, chicken without skin, beans, eggs, and tofu. Avoid pre-made and processed foods. These tend to be higher in sodium, added sugar, and fat. Reduce your daily sodium intake. Most people with hypertension should eat less than 1,500 mg of sodium a day. Lifestyle  Work with your health care provider to maintain a healthy body weight or to lose weight. Ask what an ideal weight is for you. Get at least 30 minutes of exercise that causes your heart to beat faster (aerobic exercise) most days of the week. Activities may include walking, swimming, or biking. Include exercise to strengthen your muscles (resistance exercise), such as weight lifting, as part of your weekly exercise routine. Try to do these types of exercises for 30 minutes at least 3 days a week. Do not use any products that contain nicotine or tobacco, such as cigarettes, e-cigarettes,   and chewing tobacco. If you need help quitting, ask your health care provider. Control any long-term (chronic) conditions you have, such as high  cholesterol or diabetes. Identify your sources of stress and find ways to manage stress. This may include meditation, deep breathing, or making time for fun activities. Alcohol use Do not drink alcohol if: Your health care provider tells you not to drink. You are pregnant, may be pregnant, or are planning to become pregnant. If you drink alcohol: Limit how much you use to: 0-1 drink a day for women. 0-2 drinks a day for men. Be aware of how much alcohol is in your drink. In the [place] one drink equals one 12 oz bottle of beer (355 mL), one 5 oz glass of wine (148 mL), or one 1 oz glass of hard liquor (44 mL). Medicines Your health care provider may prescribe medicine if lifestyle changes are not enough to get your blood pressure under control and if: Your systolic blood pressure is 130 or higher. Your diastolic blood pressure is 80 or higher. Take medicines only as told by your health care provider. Follow the directions carefully. Blood pressure medicines must be taken as told by your health care provider. The medicine does not work as well when you skip doses. Skipping doses also puts you at risk for problems. Monitoring Before you monitor your blood pressure: Do not smoke, drink caffeinated beverages, or exercise within 30 minutes before taking a measurement. Use the bathroom and empty your bladder (urinate). Sit quietly for at least 5 minutes before taking measurements. Monitor your blood pressure at home as told by your health care provider. To do this: Sit with your back straight and supported. Place your feet flat on the floor. Do not cross your legs. Support your arm on a flat surface, such as a table. Make sure your upper arm is at heart level. Each time you measure, take two or three readings one minute apart and record the results. You may also need to have your blood pressure checked regularly by your health care provider. General information Talk with your health care  provider about your diet, exercise habits, and other lifestyle factors that may be contributing to hypertension. Review all the medicines you take with your health care provider because there may be side effects or interactions. Keep all visits as told by your health care provider. Your health care provider can help you create and adjust your plan for managing your high blood pressure. Where to find more information National Heart, Lung, and Blood Institute: www.nhlbi.nih.gov American Heart Association: www.heart.org Contact a health care provider if: You think you are having a reaction to medicines you have taken. You have repeated (recurrent) headaches. You feel dizzy. You have swelling in your ankles. You have trouble with your vision. Get help right away if: You develop a severe headache or confusion. You have unusual weakness or numbness, or you feel faint. You have severe pain in your chest or abdomen. You vomit repeatedly. You have trouble breathing. These symptoms may represent a serious problem that is an emergency. Do not wait to see if the symptoms will go away. Get medical help right away. Call your local emergency services (911 in the [place] Do not drive yourself to the hospital. Summary Hypertension is when the force of blood pumping through your arteries is too strong. If this condition is not controlled, it may put you at risk for serious complications. Your personal target blood pressure may vary depending on   your medical conditions, your age, and other factors. For most people, a normal blood pressure is less than 120/80. Hypertension is managed by lifestyle changes, medicines, or both. Lifestyle changes to help manage hypertension include losing weight, eating a healthy, low-sodium diet, exercising more, stopping smoking, and limiting alcohol. This information is not intended to replace advice given to you by your health care provider. Make sure you discuss any questions  you have with your health care provider. Document Revised: 07/27/2019 Document Reviewed: 06/09/2019 Elsevier Patient Education  2022 [employer]  

## 2021-09-22 NOTE — Assessment & Plan Note (Signed)
Update a1c today . 

## 2021-09-22 NOTE — Assessment & Plan Note (Addendum)
No longer taking flomax.  Symptoms are stable.  ?

## 2021-09-22 NOTE — Progress Notes (Signed)
?[name] - 65 y.o. male MRN [id number]  Date of birth: [birth year] ? ?Subjective ?No chief complaint on file. ? ? ?HPI ?Andres Jordan is a 66 y.o. male here today for follow up of HTN, HLD and BPH.   ? ?BP currently managed with maxzide and metoprolol.  Has not taken meds today but readings at home are well controlled. Doing well at this time with current medications.  He denies symptoms related to HTN including chest pain, shortness of breath, palpitations, headache or vision changes.   ? ?Tolerating atorvastatin well for management of HLD. ? ?No longer taking flomax.  Feels like BPH symptoms are stable.  He does additionally use sildenafil as needed for ED symptoms.  ? ?ROS:  A comprehensive ROS was completed and negative except as noted per HPI ? ?No Known Allergies ? ?Past Medical History:  ?Diagnosis Date  ? Erectile dysfunction   ? Fasting hyperglycemia 01/09/2019  ? Hyperlipidemia   ? Hypertension   ? Hypogonadism in male   ? OSA on CPAP 10/01/2017  ? ? ?Past Surgical History:  ?Procedure Laterality Date  ? NO PAST SURGERIES    ? ? ?Social History  ? ?Socioeconomic History  ? Marital status: Single  ?  Spouse name: Not on file  ? Number of children: Not on file  ? Years of education: Not on file  ? Highest education level: Not on file  ?Occupational History  ? Not on file  ?Tobacco Use  ? Smoking status: Never  ? Smokeless tobacco: Never  ?Vaping Use  ? Vaping Use: Never used  ?Substance and Sexual Activity  ? Alcohol use: Yes  ?  Alcohol/week: 2.0 standard drinks  ?  Types: 2 Shots of liquor per week  ?  Comment: whiskey and brandy at home sometimes  ? Drug use: No  ? Sexual activity: Not Currently  ?  Partners: Male  ?  Birth control/protection: Condom  ?Other Topics Concern  ? Not on file  ?Social History Narrative  ? Not on file  ? ?Social Determinants of Health  ? ?Financial Resource Strain: Not on file  ?Food Insecurity: Not on file  ?Transportation Needs: Not on file  ?Physical Activity: Not on file   ?Stress: Not on file  ?Social Connections: Not on file  ? ? ?Family History  ?Problem Relation Age of Onset  ? Diabetes Sister   ? Hypertension Sister   ? Aneurysm Mother   ? Heart attack Father   ? ? ?Health Maintenance  ?Topic Date Due  ? TETANUS/TDAP  Never done  ? Zoster Vaccines- Shingrix (1 of 2) Never done  ? Pneumonia Vaccine 90+ Years old (1 - PCV) Never done  ? COVID-19 Vaccine (2 - Janssen risk series) 11/25/2019  ? INFLUENZA VACCINE  02/20/2021  ? COLONOSCOPY (Pts 45-65yrs Insurance coverage will need to be confirmed)  02/24/2025  ? Hepatitis C Screening  Completed  ? HPV VACCINES  Aged Out  ? ? ? ?----------------------------------------------------------------------------------------------------------------------------------------------------------------------------------------------------------------- ?Physical Exam ?BP (!) 149/80 (BP Location: Right Arm, Patient Position: Sitting, Cuff Size: Normal)   Pulse (!) 52   Ht 5\' 9"  (1.753 m)   Wt 188 lb (85.3 kg)   SpO2 100%   BMI 27.76 kg/m?  ? ?Physical Exam ?Constitutional:   ?   Appearance: Normal appearance.  ?Eyes:  ?   General: No scleral icterus. ?Cardiovascular:  ?   Rate and Rhythm: Normal rate and regular rhythm.  ?Pulmonary:  ?   Effort: Pulmonary effort is  normal.  ?   Breath sounds: Normal breath sounds.  ?Musculoskeletal:  ?   Cervical back: Neck supple.  ?Neurological:  ?   General: No focal deficit present.  ?   Mental Status: He is alert.  ?Psychiatric:     ?   Mood and Affect: Mood normal.     ?   Behavior: Behavior normal.  ? ? ?------------------------------------------------------------------------------------------------------------------------------------------------------------------------------------------------------------------- ?Assessment and Plan ? ?Hypertension goal BP (blood pressure) < 130/80 ?BP elevated today but has not taken medications for today yet.  Readings at home are well controlled.  Will continue current  medications at current strength.   ? ?Prediabetes ?Update a1c today.  ? ?Mixed hyperlipidemia ?Doing well with atorvastatin.  Update lipids and LFT's today.  ? ?Benign non-nodular prostatic hyperplasia with lower urinary tract symptoms ?No longer taking flomax.  Symptoms are stable.  ? ? ?Meds ordered this encounter  ?Medications  ? atorvastatin (LIPITOR) 20 MG tablet  ?  Sig: Take 1 tablet (20 mg total) by mouth at bedtime.  ?  Dispense:  90 tablet  ?  Refill:  3  ? metoprolol tartrate (LOPRESSOR) 50 MG tablet  ?  Sig: Take 1 tablet (50 mg total) by mouth 2 (two) times daily.  ?  Dispense:  180 tablet  ?  Refill:  3  ? triamterene-hydrochlorothiazide (MAXZIDE-25) 37.5-25 MG tablet  ?  Sig: Take 1 tablet by mouth daily.  ?  Dispense:  90 tablet  ?  Refill:  3  ? ? ?No follow-ups on file. ? ? ? ?This visit occurred during the SARS-CoV-2 public health emergency.  Safety protocols were in place, including screening questions prior to the visit, additional usage of staff PPE, and extensive cleaning of exam room while observing appropriate contact time as indicated for disinfecting solutions.  ? ?

## 2021-09-23 LAB — CBC WITH DIFFERENTIAL/PLATELET
Absolute Monocytes: 599 cells/uL (ref 200–950)
Basophils Absolute: 62 cells/uL (ref 0–200)
Basophils Relative: 1.1 %
Eosinophils Absolute: 190 cells/uL (ref 15–500)
Eosinophils Relative: 3.4 %
HCT: 48 % (ref 38.5–50.0)
Hemoglobin: 16.1 g/dL (ref 13.2–17.1)
Lymphs Abs: 1775 cells/uL (ref 850–3900)
MCH: 31 pg (ref 27.0–33.0)
MCHC: 33.5 g/dL (ref 32.0–36.0)
MCV: 92.5 fL (ref 80.0–100.0)
MPV: 10.3 fL (ref 7.5–12.5)
Monocytes Relative: 10.7 %
Neutro Abs: 2974 cells/uL (ref 1500–7800)
Neutrophils Relative %: 53.1 %
Platelets: 214 10*3/uL (ref 140–400)
RBC: 5.19 10*6/uL (ref 4.20–5.80)
RDW: 12.4 % (ref 11.0–15.0)
Total Lymphocyte: 31.7 %
WBC: 5.6 10*3/uL (ref 3.8–10.8)

## 2021-09-23 LAB — COMPLETE METABOLIC PANEL WITH GFR
AG Ratio: 2.1 (calc) (ref 1.0–2.5)
ALT: 24 U/L (ref 9–46)
AST: 23 U/L (ref 10–35)
Albumin: 4.4 g/dL (ref 3.6–5.1)
Alkaline phosphatase (APISO): 78 U/L (ref 35–144)
BUN: 23 mg/dL (ref 7–25)
CO2: 29 mmol/L (ref 20–32)
Calcium: 9.7 mg/dL (ref 8.6–10.3)
Chloride: 102 mmol/L (ref 98–110)
Creat: 1.1 mg/dL (ref 0.70–1.35)
Globulin: 2.1 g/dL (calc) (ref 1.9–3.7)
Glucose, Bld: 112 mg/dL — ABNORMAL HIGH (ref 65–99)
Potassium: 4.3 mmol/L (ref 3.5–5.3)
Sodium: 140 mmol/L (ref 135–146)
Total Bilirubin: 0.7 mg/dL (ref 0.2–1.2)
Total Protein: 6.5 g/dL (ref 6.1–8.1)
eGFR: 74 mL/min/{1.73_m2} (ref >=60)

## 2021-09-23 LAB — LIPID PANEL W/REFLEX DIRECT LDL
Cholesterol: 215 mg/dL — ABNORMAL HIGH (ref <200)
HDL: 47 mg/dL (ref >=40)
LDL Cholesterol (Calc): 145 mg/dL (calc) — ABNORMAL HIGH
Non-HDL Cholesterol (Calc): 168 mg/dL (calc) — ABNORMAL HIGH (ref <130)
Total CHOL/HDL Ratio: 4.6 (calc) (ref <5.0)
Triglycerides: 115 mg/dL (ref <150)

## 2021-09-23 LAB — HEMOGLOBIN A1C
Hgb A1c MFr Bld: 6.5 % of total Hgb — ABNORMAL HIGH (ref <5.7)
Mean Plasma Glucose: 140 mg/dL
eAG (mmol/L): 7.7 mmol/L

## 2021-09-23 LAB — PSA: PSA: 2.48 ng/mL (ref <=4.00)

## 2021-10-07 DIAGNOSIS — G4733 Obstructive sleep apnea (adult) (pediatric): Secondary | ICD-10-CM | POA: Diagnosis not present

## 2021-11-02 DIAGNOSIS — G4733 Obstructive sleep apnea (adult) (pediatric): Secondary | ICD-10-CM | POA: Diagnosis not present

## 2021-11-07 DIAGNOSIS — G4733 Obstructive sleep apnea (adult) (pediatric): Secondary | ICD-10-CM | POA: Diagnosis not present

## 2022-01-04 DIAGNOSIS — G4733 Obstructive sleep apnea (adult) (pediatric): Secondary | ICD-10-CM | POA: Diagnosis not present

## 2022-02-03 DIAGNOSIS — G4733 Obstructive sleep apnea (adult) (pediatric): Secondary | ICD-10-CM | POA: Diagnosis not present

## 2022-03-06 DIAGNOSIS — G4733 Obstructive sleep apnea (adult) (pediatric): Secondary | ICD-10-CM | POA: Diagnosis not present

## 2022-03-27 ENCOUNTER — Ambulatory Visit: Payer: BC Managed Care – PPO | Admitting: Family Medicine

## 2022-03-27 ENCOUNTER — Encounter: Payer: Self-pay | Admitting: Family Medicine

## 2022-03-27 ENCOUNTER — Other Ambulatory Visit: Payer: Self-pay

## 2022-03-27 DIAGNOSIS — I10 Essential (primary) hypertension: Secondary | ICD-10-CM

## 2022-03-27 DIAGNOSIS — E782 Mixed hyperlipidemia: Secondary | ICD-10-CM

## 2022-03-27 DIAGNOSIS — N401 Enlarged prostate with lower urinary tract symptoms: Secondary | ICD-10-CM

## 2022-03-27 DIAGNOSIS — L819 Disorder of pigmentation, unspecified: Secondary | ICD-10-CM

## 2022-03-27 DIAGNOSIS — R7303 Prediabetes: Secondary | ICD-10-CM

## 2022-03-27 LAB — POCT GLYCOSYLATED HEMOGLOBIN (HGB A1C): HbA1c, POC (prediabetic range): 6.1 % (ref 5.7–6.4)

## 2022-03-27 MED ORDER — ATORVASTATIN CALCIUM 20 MG PO TABS: 20.0000 mg | ORAL_TABLET | Freq: Every day | ORAL | 1 refills | Status: DC

## 2022-03-27 MED ORDER — ATORVASTATIN CALCIUM 20 MG PO TABS: 20.0000 mg | ORAL_TABLET | Freq: Every day | ORAL | 1 refills | Status: AC

## 2022-03-27 MED ORDER — TRIAMTERENE-HCTZ 37.5-25 MG PO TABS: 1.0000 | ORAL_TABLET | Freq: Every day | ORAL | 1 refills | Status: DC

## 2022-03-27 MED ORDER — METOPROLOL TARTRATE 50 MG PO TABS: 50.0000 mg | ORAL_TABLET | Freq: Two times a day (BID) | ORAL | 1 refills | Status: AC

## 2022-03-27 MED ORDER — METOPROLOL TARTRATE 50 MG PO TABS: 50.0000 mg | ORAL_TABLET | Freq: Two times a day (BID) | ORAL | 1 refills | Status: DC

## 2022-03-27 NOTE — Assessment & Plan Note (Signed)
Last LDL of 145.  Has been off of atorvastatin.  Has since restarted this and tolerating well.  Recommend continuation.

## 2022-03-27 NOTE — Addendum Note (Signed)
Addended by: Ardyth Man on: 03/27/2022 08:42 AM   Modules accepted: Orders

## 2022-03-27 NOTE — Assessment & Plan Note (Addendum)
A1c today is 6.1%.  Continue to work  Dietary changes and weight loss efforts.

## 2022-03-27 NOTE — Assessment & Plan Note (Signed)
Multiple lesions consistent with SK on bilateral temple area.

## 2022-03-27 NOTE — Progress Notes (Signed)
Andres Jordan - 66 y.o. male MRN [id number]  Date of birth: [birth year]  Subjective Chief Complaint  Patient presents with   Hypertension   Diabetes   Hyperlipidemia    HPI Andres Jordan is a 66 y.o. male here today for follow up.   Reports that he is doing well at this time.  Continues on maxzide and metoprolol for management of HTN.  BP is well controlled.  Denies side effects related to medication.  Has not had chest pain, shortness of breath ,palpitations, headache or vision changes.   Tolerating atorvastatin for management of HLD.   History of prediabetes.  Last a1c of 6.5%. He has worked on dietary changes to hopefully help with this.    He is retiring and moving to [place] later this year.   ROS:  A comprehensive ROS was completed and negative except as noted per HPI  No Known Allergies  Past Medical History:  Diagnosis Date   Erectile dysfunction    Fasting hyperglycemia 01/09/2019   Hyperlipidemia    Hypertension    Hypogonadism in male    OSA on CPAP 10/01/2017    Past Surgical History:  Procedure Laterality Date   NO PAST SURGERIES      Social History   Socioeconomic History   Marital status: Single    Spouse name: Not on file   Number of children: Not on file   Years of education: Not on file   Highest education level: Not on file  Occupational History   Not on file  Tobacco Use   Smoking status: Never   Smokeless tobacco: Never  Vaping Use   Vaping Use: Never used  Substance and Sexual Activity   Alcohol use: Yes    Alcohol/week: 2.0 standard drinks of alcohol    Types: 2 Shots of liquor per week    Comment: whiskey and brandy at home sometimes   Drug use: No   Sexual activity: Not Currently    Partners: Male    Birth control/protection: Condom  Other Topics Concern   Not on file  Social History Narrative   Not on file   Social Determinants of Health   Financial Resource Strain: Not on file  Food Insecurity: Not on file  Transportation  Needs: Not on file  Physical Activity: Not on file  Stress: Not on file  Social Connections: Not on file    Family History  Problem Relation Age of Onset   Diabetes Sister    Hypertension Sister    Aneurysm Mother    Heart attack Father     Health Maintenance  Topic Date Due   COVID-19 Vaccine (3 - Janssen risk series) 10/09/2022 (Originally 07/27/2020)   INFLUENZA VACCINE  10/21/2022 (Originally 02/20/2022)   COLONOSCOPY (Pts 45-69yrs Insurance coverage will need to be confirmed)  02/24/2025   TETANUS/TDAP  05/19/2031   Pneumonia Vaccine 41+ Years old  Completed   Hepatitis C Screening  Completed   Zoster Vaccines- Shingrix  Completed   HPV VACCINES  Aged Out     ----------------------------------------------------------------------------------------------------------------------------------------------------------------------------------------------------------------- Physical Exam BP 128/72 (BP Location: Left Arm, Patient Position: Sitting, Cuff Size: Normal)   Pulse (!) 51   Ht 5\' 9"  (1.753 m)   Wt 186 lb (84.4 kg)   SpO2 99%   BMI 27.47 kg/m   Physical Exam Constitutional:      Appearance: Normal appearance.  Eyes:     General: No scleral icterus. Cardiovascular:     Rate and Rhythm: Normal rate and regular  rhythm.  Pulmonary:     Effort: Pulmonary effort is normal.     Breath sounds: Normal breath sounds.  Musculoskeletal:     Cervical back: Neck supple.  Skin:    Comments: Slightly raised, pigmented, waxy appearing lesions on bilateral temples.   Neurological:     Mental Status: He is alert.  Psychiatric:        Mood and Affect: Mood normal.        Behavior: Behavior normal.     ------------------------------------------------------------------------------------------------------------------------------------------------------------------------------------------------------------------- Assessment and Plan  Hypertension goal BP (blood pressure) <  130/80 BP is well controlled at this time.  Recommend continuation of current medications for management of HTN  Prediabetes A1c today is 6.1%.  Continue to work  Dietary changes and weight loss efforts.   Mixed hyperlipidemia Last LDL of 145.  Has been off of atorvastatin.  Has since restarted this and tolerating well.  Recommend continuation.    Benign non-nodular prostatic hyperplasia with lower urinary tract symptoms Previously on flomax.  Stable symptoms at this time.    Meds ordered this encounter  Medications   triamterene-hydrochlorothiazide (MAXZIDE-25) 37.5-25 MG tablet    Sig: Take 1 tablet by mouth daily.    Dispense:  90 tablet    Refill:  1   metoprolol tartrate (LOPRESSOR) 50 MG tablet    Sig: Take 1 tablet (50 mg total) by mouth 2 (two) times daily.    Dispense:  180 tablet    Refill:  1   atorvastatin (LIPITOR) 20 MG tablet    Sig: Take 1 tablet (20 mg total) by mouth at bedtime.    Dispense:  90 tablet    Refill:  1    No follow-ups on file.    This visit occurred during the SARS-CoV-2 public health emergency.  Safety protocols were in place, including screening questions prior to the visit, additional usage of staff PPE, and extensive cleaning of exam room while observing appropriate contact time as indicated for disinfecting solutions.

## 2022-03-27 NOTE — Patient Instructions (Signed)
Great to see you today! I have sent in refills of medications.  I wish you the best in [place]!

## 2022-03-27 NOTE — Assessment & Plan Note (Signed)
Previously on flomax.  Stable symptoms at this time.

## 2022-03-27 NOTE — Assessment & Plan Note (Signed)
BP is well controlled at this time.  Recommend continuation of current medications for management of HTN.   

## 2022-04-12 DIAGNOSIS — G4733 Obstructive sleep apnea (adult) (pediatric): Secondary | ICD-10-CM | POA: Diagnosis not present

## 2022-05-12 DIAGNOSIS — G4733 Obstructive sleep apnea (adult) (pediatric): Secondary | ICD-10-CM | POA: Diagnosis not present

## 2022-12-20 ENCOUNTER — Other Ambulatory Visit: Payer: Self-pay | Admitting: Family Medicine

## 2022-12-20 DIAGNOSIS — I10 Essential (primary) hypertension: Secondary | ICD-10-CM

## 2023-01-15 ENCOUNTER — Other Ambulatory Visit: Payer: Self-pay | Admitting: Family Medicine

## 2023-01-15 DIAGNOSIS — I10 Essential (primary) hypertension: Secondary | ICD-10-CM

## 2023-01-19 ENCOUNTER — Other Ambulatory Visit: Payer: Self-pay | Admitting: Family Medicine

## 2023-01-19 DIAGNOSIS — I10 Essential (primary) hypertension: Secondary | ICD-10-CM

## 2023-01-27 ENCOUNTER — Other Ambulatory Visit: Payer: Self-pay | Admitting: Family Medicine

## 2023-01-27 DIAGNOSIS — I10 Essential (primary) hypertension: Secondary | ICD-10-CM
